# Patient Record
Sex: Male | Born: 1960 | Race: White | Hispanic: No | Marital: Single | Smoking: Never smoker
Health system: Southern US, Community
[De-identification: ages and names within clinical notes are randomized; demographics above are authoritative.]

## PROBLEM LIST (undated history)

## (undated) DIAGNOSIS — E785 Hyperlipidemia, unspecified: Secondary | ICD-10-CM

## (undated) DIAGNOSIS — C20 Malignant neoplasm of rectum: Secondary | ICD-10-CM

## (undated) DIAGNOSIS — E119 Type 2 diabetes mellitus without complications: Secondary | ICD-10-CM

## (undated) DIAGNOSIS — G4733 Obstructive sleep apnea (adult) (pediatric): Secondary | ICD-10-CM

## (undated) DIAGNOSIS — T7840XA Allergy, unspecified, initial encounter: Secondary | ICD-10-CM

## (undated) DIAGNOSIS — Z87442 Personal history of urinary calculi: Secondary | ICD-10-CM

## (undated) DIAGNOSIS — C801 Malignant (primary) neoplasm, unspecified: Secondary | ICD-10-CM

## (undated) DIAGNOSIS — I10 Essential (primary) hypertension: Secondary | ICD-10-CM

## (undated) HISTORY — DX: Malignant (primary) neoplasm, unspecified: C80.1

## (undated) HISTORY — DX: Allergy, unspecified, initial encounter: T78.40XA

## (undated) HISTORY — DX: Obstructive sleep apnea (adult) (pediatric): G47.33

## (undated) HISTORY — DX: Type 2 diabetes mellitus without complications: E11.9

## (undated) HISTORY — DX: Hyperlipidemia, unspecified: E78.5

## (undated) HISTORY — PX: APPENDECTOMY: SHX54

## (undated) HISTORY — DX: Essential (primary) hypertension: I10

## (undated) HISTORY — DX: Malignant neoplasm of rectum: C20

## (undated) HISTORY — PX: HERNIA REPAIR: SHX51

---

## 2011-10-21 NOTE — Progress Notes (Addendum)
 [phone] (home) [phone] (work)  Subjective:  Adam Gregory is a 51 y.o. yo male here for  Chief Complaint  Patient presents with  . Worrisome Lesion    Scalp  .    Problem 1 Problem 2 Problem 3  Location/Area involved? Scalp    How Long? 4 1/2 weeks    Symptoms (itch/pain?) sore    Bleeding? no    Change size/shape/color? Increase in size over the weeks    Treatment Tried? no    Factors better/worse? worse      Please see the medications, PMH, allergies, FH, and SH as reviewed in the sections above.  Objective:  WDWN male in NAD; A & O x 3 with normal affect  1.0 cm crusted keratotic papule      Exam Lexicon - AK = red, scaly papule ; SK = stuck on waxy plaque ; C = cherry red papule; NEVI = pigmented macule/papule ; BCC = pearly papule/plaque/nodule ; SCC= keratotic indurated papule/nodule; Ecz = scaling erythematous patches/plaques; MC= flesh colored umbilicated papule; VV = rough, warty papule/plaque; NERD = no evidence of recurrent disease at site of skin cancer removal ; DF= firm, brown papule with positive retraction sign; ISK = stuck on waxy papule with erythema and/or crusting; EIC = cystic nodule/papule with punctum   Skin exam performed on following areas:  Scalp - declined fbse Exam within normal limits unless noted above.   Assessment and Plan:  1. Neoplasm of uncertain behavior of skin  CUTANEOUS PATH REQ-HITCH/SANG   Rule out Squamous cell carcinoma Site 1: left parietal scalp   Biopsy:   After explaining the nature of the procedure, risks (including, but not limited to bleeding, infection, scarring, recurrence, numbness), benefits, and alternatives, the patient signed the written informed consent.  The site was confirmed and marked, the patient was identified and time out was taken.  The skin was cleansed with alcohol.  Local anesthesia was achieved with 1% lidocaine with epinephrine buffered with sodium bicarbonate.  A portion of the lesion  was removed using a dermablade and the specimen was submitted for pathology.  Hemostasis was achieved with 50% aluminum chloride, and a dressing of petrolatum and a bandaid was applied.    *All new prescription medications, changes in current prescription dosages and sample medications were discussed with the patient, including patient education, medication name, use, dosage, potential side effects, complications, and special instructions. *Sun avoidance and sun protection discussed *Patient to call for appointment for any new, nonhealing, or changing lesion

## 2014-09-12 ENCOUNTER — Ambulatory Visit (INDEPENDENT_AMBULATORY_CARE_PROVIDER_SITE_OTHER): Payer: 59 | Admitting: Family Medicine

## 2014-09-12 VITALS — BP 130/82 | HR 75 | Temp 97.6°F | Resp 18 | Ht 74.0 in | Wt >= 6400 oz

## 2014-09-12 DIAGNOSIS — R059 Cough, unspecified: Secondary | ICD-10-CM

## 2014-09-12 DIAGNOSIS — R05 Cough: Secondary | ICD-10-CM | POA: Diagnosis not present

## 2014-09-12 DIAGNOSIS — J0101 Acute recurrent maxillary sinusitis: Secondary | ICD-10-CM

## 2014-09-12 MED ORDER — BENZONATATE 200 MG PO CAPS: 200.0000 mg | ORAL_CAPSULE | Freq: Three times a day (TID) | ORAL | Status: DC | PRN

## 2014-09-12 MED ORDER — LEVOFLOXACIN 500 MG PO TABS: 500.0000 mg | ORAL_TABLET | Freq: Every day | ORAL | Status: DC

## 2014-09-12 NOTE — Patient Instructions (Signed)

## 2014-09-12 NOTE — Progress Notes (Signed)
Patient ID: Adam Gregory, male   DOB: [birth year], 54 y.o.   MRN: [id number]  This chart was scribed for Robyn Haber, MD by Ladene Artist, ED Scribe. The patient was seen in room 10. Patient's care was started at 11:40 AM.  Patient ID: Adam Gregory MRN: [id number], DOB: 1960/10/21, 54 y.o. Date of Encounter: 09/12/2014, 11:40 AM  Primary Physician: No PCP Per Patient  Chief Complaint  Patient presents with  . Sinusitis    x6 days   . Nasal Congestion    green mucous   . Sore Throat   HPI: 54 y.o. year old male with history below presents with persistent sore throat for the past 6 days. with history below presents with persistent sore throat for the past 6 days. Pt describes sore throat as a burning sensation that is exacerbated with swallowing. He states that sore throat is so severe that it keeps him up at night. He reports associated nasal congestion with green mucous, cough, throbbing HA for the past few days. Pt has been treating with Zyrtec D and gargling warm salt water without relief. He reports h/o sleep apnea; he uses a cpap which has improved sinusitis.   Pt works as a Engineer, technical sales.  Past Medical History  Diagnosis Date  . Allergy   . Cancer     Home Meds: Prior to Admission medications   Medication Sig Start Date End Date Taking? Authorizing Provider  cetirizine (ZYRTEC) 10 MG tablet Take 10 mg by mouth daily.   Yes Historical Provider, MD    Allergies:  Allergies  Allergen Reactions  . Codeine   . Ivp Dye [Iodinated Diagnostic Agents]   . Shellfish Allergy   . Vicodin [Hydrocodone-Acetaminophen]     History   Social History  . Marital Status: Single    Spouse Name: N/A  . Number of Children: N/A  . Years of Education: N/A   Occupational History  . Not on file.   Social History Main Topics  . Smoking status: Never Smoker   . Smokeless tobacco: Not on file  . Alcohol Use: 0.6 oz/week    1 Standard drinks or equivalent per week  . Drug Use: No  . Sexual Activity: Not on file   Other Topics Concern   . Not on file   Social History Narrative  . No narrative on file    Review of Systems: Constitutional: negative for chills, fever, night sweats, weight changes, or fatigue  HEENT: negative for vision changes, hearing loss, rhinorrhea, epistaxis, or sinus pressure, + sore throat,  + congestion  Cardiovascular: negative for chest pain or palpitations Respiratory: negative for hemoptysis, wheezing, shortness of breath, + cough Abdominal: negative for abdominal pain, nausea, vomiting, diarrhea, or constipation Dermatological: negative for rash Neurologic: negative for dizziness, or syncope, + headache All other systems reviewed and are otherwise negative with the exception to those above and in the HPI.  Physical Exam: Blood pressure 130/82, pulse 75, temperature 97.6 F (36.4 C), temperature source Oral, resp. rate 18, height 6\' 2"  (1.88 m), weight 440 lb (199.583 kg), SpO2 96 %., Body mass index is 56.47 kg/(m^2). General: Well developed, well nourished, in no acute distress. Head: Normocephalic, atraumatic, eyes without discharge, sclera non-icteric. Mucosal purulent discharge. Bilateral auditory canals clear, TM's are without perforation, pearly grey and translucent with reflective cone of light bilaterally. Oral cavity moist, posterior pharynx with erythema. No posterior pharynx exudate, peritonsillar abscess, or post nasal drip.  Neck: Supple. No thyromegaly. Full ROM. No lymphadenopathy. Lungs: Few expiratory wheezes. No rales, or rhonchi. Breathing is unlabored.  Heart: RRR with S1 S2. No murmurs, rubs, or gallops appreciated. Abdomen: Soft, non-tender, non-distended with normoactive bowel sounds. No hepatomegaly. No rebound/guarding. No obvious abdominal masses. Msk:  Strength and tone normal for 54. Extremities/Skin: Warm and dry. No clubbing or cyanosis. No edema. No rashes or suspicious lesions. Neuro: Alert and oriented X 3. Moves all extremities spontaneously. Gait is normal.  CNII-XII grossly in tact. Psych:  Responds to questions appropriately with a normal affect.    ASSESSMENT AND PLAN:  54 y.o. year old male with  1. Recurrent maxillary sinusitis, unspecified chronicity   2. Cough    This chart was scribed in my presence and reviewed by me personally.    ICD-9-CM ICD-10-CM   1. Recurrent maxillary sinusitis, unspecified chronicity 461.0 J01.01 levofloxacin (LEVAQUIN) 500 MG tablet  2. Cough 786.2 R05 benzonatate (TESSALON) 200 MG capsule     Signed, Robyn Haber, MD   Signed, Robyn Haber, MD 09/12/2014 11:40 AM

## 2018-12-14 NOTE — H&P (Signed)
 [facility]  History and Physical   Assessment  Colorectal cancer screening.  Plan  Screening colonoscopy.  History  Adam Gregory is a 58 y.o. male who presents for colonoscopy. History of Present Illness   Past Medical History:  Diagnosis Date  . Diabetes mellitus (*)   . OSA on CPAP    Past Surgical History:  Procedure Laterality Date  . Appendectomy    . Inguinal hernia repair Bilateral    2 on R, 1 on L    Allergies  Allergen Reactions  . Shellfish-Derived Products Anaphylaxis  . Vicodin [Hydrocodone-Acetaminophen ] Itching  . Codeine Nausea And Vomiting  . Iodinated Diagnostic Agents Hives and Other    Sends into cardiac arrest   Prior to Admission medications   Medication Sig Start Date End Date Taking? Authorizing Provider  BAYER CONTOUR NEXT TEST test strip TEST 3 TIMES A DAY 05/05/17  Yes Lynwood JAYSON Mirza, MD  Blood Glucose Calibration LIQD 1 each by In Vitro route as needed. 03/10/15  Yes Jessica M Rebollar, PA-C  Blood Glucose Monitoring Suppl (BAYER CONTOUR NEXT MONITOR) W/DEVICE KIT Inject 1 each into the skin 3 (three) times a day. To check blood sugar 06/08/15  Yes Lynwood JAYSON Mirza, MD  cetirizine (ZYRTEC) 10 mg tablet Take 10 mg by mouth daily.    Yes Historical Provider, MD  KRILL OIL PO Take by mouth.   Yes Historical Provider, MD  metFORMIN (GLUCOPHAGE) 500 MG tablet Take one tablet (500 mg dose) by mouth 2 (two) times daily with meals. 09/28/18  Yes Lynwood JAYSON Mirza, MD  [facility] 50 MCG/0.5ML injection Inject 0.5 mLs into the muscle once as needed. And repeat once after 2-6 months 03/20/18 03/20/19  Lynwood JAYSON Mirza, MD   Social History   Socioeconomic History  . Marital status: Single    Spouse name: Not on file  . Number of children: 0  . Years of education: Not on file  . Highest education level: Not on file  Occupational History  . Not on file  Social Needs  . Financial resource strain: Not on file  . Food insecurity     Worry: Not on file    Inability: Not on file  . Transportation needs    Medical: Not on file    Non-medical: Not on file  Tobacco Use  . Smoking status: Former Smoker    Packs/day: 0.00    Last attempt to quit: 03/07/2009    Years since quitting: 9.7  . Smokeless tobacco: Never Used  Substance and Sexual Activity  . Alcohol use: No    Comment: rarely  . Drug use: No  . Sexual activity: Not on file  Lifestyle  . Physical activity    Days per week: Not on file    Minutes per session: Not on file  . Stress: Not on file  Relationships  . Social Musician on phone: Not on file    Gets together: Not on file    Attends religious service: Not on file    Active member of club or organization: Not on file    Attends meetings of clubs or organizations: Not on file    Relationship status: Not on file  . Intimate partner violence    Fear of current or ex partner: Not on file    Emotionally abused: Not on file    Physically abused: Not on file    Forced sexual activity: Not on file  Other Topics Concern  . Not on file  Social History Narrative  . Not on file   Family History  Problem Relation Age of Onset  . Hypertension Mother   . Diabetes Father   . Lymphoma Sister        NHL  . Stroke Brother        1 in each optic nerve  . Cancer Neg Hx   . Heart attack Neg Hx    Review of Systems  All other systems reviewed and are negative.        Physical Examination           Physical Exam Cardiovascular:     Rate and Rhythm: Normal rate and regular rhythm.  Pulmonary:     Effort: Pulmonary effort is normal.     Breath sounds: Normal breath sounds.  Abdominal:     General: Abdomen is flat. Bowel sounds are normal.     Palpations: Abdomen is soft.  Neurological:     Mental Status: He is alert and oriented to person, place, and time.    Results  Labs:  No results found for this or any previous visit (from the past 24 hour(s)). Imaging: No results  found.  Coding  Electronically signed: Feliciano MARLA Hamilton, MD 12/14/2018 / 12:19 PM

## 2019-01-01 NOTE — Progress Notes (Signed)
 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 Darral Medical Oncologist: Dr. Clotilda Radiation Oncologist: Dr. Mollie Coup cancer [facility])  12/2018 Initial Diagnosis   Anal cancer [facility]) - presented with anal mass and rectal bleeding   12/14/2018 Interventional Procedure   Colonoscopy with biopsy: Large ulcerated anorectal mass abutting the dentate line. 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 male with T2DM, OSA, HLD, morbid obesity, and new diagnosis of anal cancer. He initially presented with a new palpable anal mass and rectal bleeding. He had eaten some celery and had increased bleeding with BMs after that. Since then he has had some spotting. He told his PCP who referred him to GI for a colonoscopy. The colonoscopy on 12/14/18 showed a large ulcerated anorectal mass abutting the dentate line with biopsy revealing invasive poorly differentiated squamous cell carcinoma.    Currently feeling ok. Denied any new masses/swelling in the groin or neck. No history of STDs or HIV. Quit smoking many years ago. BMs for the most part are normal. With some mucous discharge at times when he has tenesmus sensation. Has lost 26 lbs over the last few months. Working full time as a Psychologist, prison and probation services.  Prior RT: No IED: No Scleroderma, SLE, MCTD or RA: No  ROS: 14 point review of systems was covered and is negative except as mentioned above in HPI.   Meds Ordered in [place]  Medication Sig Dispense Refill  . cetirizine (ZYRTEC) 10 MG tablet Take 10 mg by mouth.    . metFORMIN (GLUCOPHAGE) 500 MG tablet 500 mg 2 times daily with meals.       . predniSONE  (DELTASONE ) 50 MG tablet One tablet 13 hours, one tablet 7 hours and one tablet an hour before MRI. Also take Bendaryl 50 mg OTC once an hour before MRI. 3 tablet 0    Current Facility-Administered Medications Ordered in Epic  Medication Dose Route Frequency Provider Last Rate Last Dose  . [START ON 01/07/2019] glucagon (human recombinant) injection 1 mg  1 mg Intramuscular Once Syed Ali Margarito Pringle, MD        No past medical history on file.  No past surgical history on file.  No family history on file.  Allergies  Allergen Reactions  . Hydrocodone-Acetaminophen  Itching / Pruritis (ALLERGY/intolerance)  . Iodine And Iodide Containing Products Urticaria / Hives (ALLERGY) and Itching / Pruritis (ALLERGY/intolerance)    Sends into cardiac arrest Anaphylaxis   . Codeine Nausea & Vomiting (ALLERGY/intolerance)  . Shellfish Containing Products Urticaria / Hives (ALLERGY)    Social History   Socioeconomic History  . Marital status: Single    Spouse name: Not on file  . Number of children: Not on file  . Years of education: Not on file  . Highest education level: Not on file  Occupational History  . Not on file  Social Needs  . Financial resource strain: Not on file  . Food insecurity    Worry: Not on file    Inability: Not on file  . Transportation needs    Medical: Not on file    Non-medical: Not on file  Tobacco Use  . Smoking status: Former Games developer  . Smokeless tobacco: Never Used  Substance and Sexual Activity  . Alcohol use: Not on file  . Drug use: Not on file  . Sexual activity: Not on  file  Lifestyle  . Physical activity    Days per week: Not on file    Minutes per session: Not on file  . Stress: Not on file  Relationships  . Social Musician on phone: Not on file    Gets together: Not on file    Attends religious service: Not on file    Active member of club or organization: Not on file    Attends meetings of clubs or organizations: Not on file    Relationship status: Not on file  Other Topics Concern  . Not on file  Social History Narrative  . Not on file    ADDRESS: [address] [place] [facility]  [id number]   Physical Exam: Vital Signs:  Vitals:   01/04/19 0848  BP: 170/71  Pulse: 61  Resp: 16  SpO2: 100%  PainSc: 0-Zero   Wt Readings from Last 3 Encounters:  01/04/19 (!) 178.6 kg (393 lb 12.8 oz)  12/29/18 (!) 182.8 kg (403 lb)   Pain score: 0-Zero   GENERAL: ECOG: 0 - Fully active; no performance restrictions KPS (100-90%). Well developed, well nourished, in no acute distress. Alert & oriented, converses well.  EYES: Pupils equal, round and reactive to light. Extraocular movements intact. EARS/NOSE/MOUTH/THROAT: Nose normal. Oral mucosa moist, without lesions. CARDIOVASCULAR: Regular rate and rhythm. Normal heart sounds. RESPIRATORY: Normal respiratory effort. Lungs clear to auscultation bilaterally. ABDOMEN: Normal bowel sounds. Soft, non-tender, non-distended. EXTREMITIES: Well-perfused. NEUROLOGIC EXAM: CN II-XII grossly intact. No focal neurologic deficits appreciated. Strength 5/5 in the bilateral upper and lower extremity muscle groups. Sensation intact to light touch throughout.  SKIN: Warm, dry, intact. No rashes noted.  LYMPH: No cervical, supraclavicular, infraclavicular or axillary adenopathy  Rectal exam: no evidence of inflamed external hemorrhoids.  Normal sphincter tone. Appreciated a 2-3 cm mass at 6 oclock with patient in left lateral decubitus position (patient's left). No blood grossly on the exam glove.    Labs: Results for orders placed or performed in visit on 12/29/18 (from the past 336 hour(s))  Comprehensive Metabolic Panel   Collection Time: 12/29/18 10:42 AM  Result Value Ref Range   Sodium 140 135 - 146 MMOL/L   Potassium 4.2 3.5 - 5.3 MMOL/L   Chloride 107 98 - 110 MMOL/L   CO2 27 23 - 30 MMOL/L   BUN 13 8 - 24 MG/DL   Glucose 878 (H) 70 - 99 MG/DL   Creatinine 9.09 9.49 - 1.50 MG/DL   Calcium 9.1 8.5 - 89.4 MG/DL   Total Protein 7.5 6.0 - 8.3 G/DL   Albumin  4.0 3.5 - 5.0 G/DL   Total Bilirubin 0.5 0.1 - 1.2 MG/DL   Alkaline  Phosphatase 61 25 - 125 IU/L or U/L   AST (SGOT) 17 5 - 40 IU/L or U/L   ALT (SGPT) 22 5 - 50 IU/L or U/L   Anion Gap 6 MMOL/L   Est. GFR Non-African American >=90 >=60 ML/MIN/1.73 M*2   HIV Antigen Antibody Combo   Collection Time: 12/29/18 10:42 AM  Result Value Ref Range   HIV Antigen Antibody Combo Non-Reactive Non-Reactive  Hepatitis Panel   Collection Time: 12/29/18 10:42 AM  Result Value Ref Range   Hepatitis B Surface Antibody, QL REACTIVE (EQUIVALENT TO >=10 mIU/mL)    HEP-B Core AB Non-Reactive Non-Reactive   Hepatitis C Antibody Non-Reactive Non-Reactive   Hepatitis A (IGG and IGM) Antibody Reactive (A) Non-Reactive   Hepatitis B Surface Antigen Non-Reactive Non-Reactive  Hepatitis  A Ab (IgM)   Collection Time: 12/29/18 10:42 AM  Result Value Ref Range   Hepatitis A IGM Antibody Non-Reactive Non-Reactive  CBC and Differential   Collection Time: 12/29/18 10:42 AM  Result Value Ref Range   WBC 6.9 4.8 - 10.8 x 10*3/uL   RBC 4.47 (L) 4.70 - 6.10 x 10*6/uL   Hemoglobin 13.5 (L) 14.0 - 18.0 G/DL   Hematocrit 59.9 (L) 57.9 - 52.0 %   MCV 89.4 80.0 - 94.0 FL   MCH 30.2 27.0 - 31.0 PG   MCHC 33.7 33.0 - 37.0 G/DL   RDW 86.5 88.4 - 85.4 %   Platelets 214 160 - 360 X 10*3/uL   MPV 9.7 6.8 - 10.2 FL   Neutrophil % 69 %   Lymphocyte % 20 %   Monocyte % 10 %   Eosinophil % 1 %   Basophil % 1 %   Neutrophil Absolute 4.7 1.6 - 7.3 x 10*3/uL   Lymphocyte Absolute 1.4 1.0 - 5.1 x 10*3/uL   Monocyte Absolute 0.7 0.2 - 0.9 x 10*3/uL   Eosinophil Absolute 0.1 0.0 - 0.5 x 10*3/uL   Basophil Absolute 0.0 0.0 - 0.2 x 10*3/uL   NRBC Manual 0 <=0 / 100 WBC   nRBC      Colonoscopy (12/14/18) Postprocedure Impression:  Anorectal mass lesion which is ulcerated and consistent with malignancy,  biopsied. Hepatic flexure colon polyp, removed. Extensive left-sided diverticulosis.  Findings: Large palpable lesion on rectal exam. There is a large mass which is ulcerated abutting the  dentate line.  It is  hard to determine whether this originates from the anus versus the rectum.   Biopsies were obtained.  This is consistent with a malignancy. 4 mm sessile colon polyp at the hepatic flexure removed with a cold snare. Extensive left-sided diverticulosis.   Radiology:  No new pertinent radiography available for review.   Pathology:  12/14/18 1.  Colon, hepatic flexure, polyp, polypectomy: -Colonic mucosa with benign submucosal leiomyoma; multiple levels examined  2.  Colon, rectal anal mass, biopsy: -Detached fragments of invasive poorly differentiated squamous cell carcinoma, see comment.  COMMENT: Immunohistochemical stains are positive for p40 and p16.   Assessment and Plan: Moe Graca is a 58 y.o. male with a new diagnosis of anal cancer which is incompletely staged at this time. He has MRI and PET imaging scheduled tomorrow. He has met with Dr. Clotilda with medical oncology already and has an appointment scheduled with colorectal surgery with Dr. Darral. He presents today for evaluation of definitive radiotherapy.  We discussed the general treatment paradigms for anal squamous cell carcinoma including chemoradiotherapy, RT alone or local excision. We discussed the logistics of therapy as well as the potential toxicities of treatment including but not limited to anal pain/irritation, pain or bleeding with defecation, diarrhea, nausea/vomiting, fatigue, skin irritation, weight loss, damage to normal structures like bowel (including bowel obstruction) or bladder. Ozell Sherwood Minerva expressed understanding and agreement with the plan. He has signed consent for treatment today and we will proceed with treatment planning in the near future. He has seen medical oncology for evaluation of concurrent chemotherapy.      Electronically signed by: Rosalva Lamar Sandy, MD Resident 01/04/19 [phone]

## 2019-02-15 ENCOUNTER — Other Ambulatory Visit: Payer: Self-pay | Admitting: Internal Medicine

## 2019-02-15 ENCOUNTER — Other Ambulatory Visit: Payer: Self-pay | Admitting: Neurosurgery

## 2019-02-15 DIAGNOSIS — M5412 Radiculopathy, cervical region: Secondary | ICD-10-CM

## 2019-02-15 DIAGNOSIS — M5416 Radiculopathy, lumbar region: Secondary | ICD-10-CM

## 2019-02-15 DIAGNOSIS — C210 Malignant neoplasm of anus, unspecified: Secondary | ICD-10-CM

## 2019-02-22 ENCOUNTER — Other Ambulatory Visit: Payer: Self-pay | Admitting: Internal Medicine

## 2019-03-13 ENCOUNTER — Other Ambulatory Visit: Payer: Self-pay

## 2019-03-22 ENCOUNTER — Ambulatory Visit
Admission: RE | Admit: 2019-03-22 | Discharge: 2019-03-22 | Disposition: A | Discharge status: Home / Self Care | Payer: BC Managed Care – PPO | Source: Ambulatory Visit | Attending: Internal Medicine | Admitting: Internal Medicine

## 2019-03-22 DIAGNOSIS — C210 Malignant neoplasm of anus, unspecified: Secondary | ICD-10-CM

## 2019-08-22 ENCOUNTER — Emergency Department
Admission: EM | Admit: 2019-08-22 | Discharge: 2019-08-22 | Disposition: A | Discharge status: Home / Self Care | Payer: BC Managed Care – PPO | Attending: Emergency Medicine | Admitting: Emergency Medicine

## 2019-08-22 ENCOUNTER — Encounter: Payer: Self-pay | Admitting: Emergency Medicine

## 2019-08-22 ENCOUNTER — Other Ambulatory Visit: Payer: Self-pay

## 2019-08-22 ENCOUNTER — Emergency Department: Payer: BC Managed Care – PPO

## 2019-08-22 DIAGNOSIS — R1031 Right lower quadrant pain: Secondary | ICD-10-CM | POA: Diagnosis present

## 2019-08-22 DIAGNOSIS — Z79899 Other long term (current) drug therapy: Secondary | ICD-10-CM | POA: Diagnosis not present

## 2019-08-22 DIAGNOSIS — N200 Calculus of kidney: Secondary | ICD-10-CM | POA: Insufficient documentation

## 2019-08-22 LAB — URINALYSIS, COMPLETE (UACMP) WITH MICROSCOPIC
Bacteria, UA: NONE SEEN
Bilirubin Urine: NEGATIVE
Glucose, UA: 150 mg/dL — AB
Hgb urine dipstick: NEGATIVE
Ketones, ur: 5 mg/dL — AB
Leukocytes,Ua: NEGATIVE
Nitrite: NEGATIVE
Protein, ur: NEGATIVE mg/dL
Specific Gravity, Urine: 1.021 (ref 1.005–1.030)
Squamous Epithelial / HPF: NONE SEEN (ref 0–5)
pH: 7 (ref 5.0–8.0)

## 2019-08-22 LAB — COMPREHENSIVE METABOLIC PANEL
ALT: 34 U/L (ref 0–44)
AST: 26 U/L (ref 15–41)
Albumin: 4 g/dL (ref 3.5–5.0)
Alkaline Phosphatase: 66 U/L (ref 38–126)
Anion gap: 11 (ref 5–15)
BUN: 17 mg/dL (ref 6–20)
CO2: 23 mmol/L (ref 22–32)
Calcium: 9.2 mg/dL (ref 8.9–10.3)
Chloride: 105 mmol/L (ref 98–111)
Creatinine, Ser: 1.14 mg/dL (ref 0.61–1.24)
GFR calc Af Amer: >60 mL/min (ref >60)
GFR calc non Af Amer: >60 mL/min (ref >60)
Glucose, Bld: 211 mg/dL — ABNORMAL HIGH (ref 70–99)
Potassium: 4.1 mmol/L (ref 3.5–5.1)
Sodium: 139 mmol/L (ref 135–145)
Total Bilirubin: 0.6 mg/dL (ref 0.3–1.2)
Total Protein: 7.5 g/dL (ref 6.5–8.1)

## 2019-08-22 LAB — CBC
HCT: 38.4 % — ABNORMAL LOW (ref 39.0–52.0)
Hemoglobin: 12.8 g/dL — ABNORMAL LOW (ref 13.0–17.0)
MCH: 31.3 pg (ref 26.0–34.0)
MCHC: 33.3 g/dL (ref 30.0–36.0)
MCV: 93.9 fL (ref 80.0–100.0)
Platelets: 207 10*3/uL (ref 150–400)
RBC: 4.09 MIL/uL — ABNORMAL LOW (ref 4.22–5.81)
RDW: 13.2 % (ref 11.5–15.5)
WBC: 9.8 10*3/uL (ref 4.0–10.5)
nRBC: 0 % (ref 0.0–0.2)

## 2019-08-22 LAB — LIPASE, BLOOD: Lipase: 37 U/L (ref 11–51)

## 2019-08-22 MED ORDER — SODIUM CHLORIDE 0.9 % IV BOLUS
500.0000 mL | Freq: Once | INTRAVENOUS | Status: AC
  Administered 2019-08-22: 500 mL via INTRAVENOUS

## 2019-08-22 MED ORDER — FENTANYL CITRATE (PF) 100 MCG/2ML IJ SOLN
50.0000 ug | INTRAMUSCULAR | Status: DC | PRN
  Administered 2019-08-22: 50 ug via INTRAVENOUS
  Filled 2019-08-22: qty 2

## 2019-08-22 MED ORDER — TAMSULOSIN HCL 0.4 MG PO CAPS: 0.4000 mg | ORAL_CAPSULE | Freq: Every day | ORAL | 0 refills | Status: DC

## 2019-08-22 MED ORDER — KETOROLAC TROMETHAMINE 30 MG/ML IJ SOLN
30.0000 mg | Freq: Once | INTRAMUSCULAR | Status: AC
  Administered 2019-08-22: 30 mg via INTRAVENOUS
  Filled 2019-08-22: qty 1

## 2019-08-22 MED ORDER — SODIUM CHLORIDE 0.9% FLUSH: 3.0000 mL | Freq: Once | INTRAVENOUS | Status: DC

## 2019-08-22 MED ORDER — ONDANSETRON 4 MG PO TBDP: 4.0000 mg | ORAL_TABLET | Freq: Four times a day (QID) | ORAL | 0 refills | Status: DC | PRN

## 2019-08-22 MED ORDER — OXYCODONE-ACETAMINOPHEN 5-325 MG PO TABS: 1.0000 | ORAL_TABLET | Freq: Four times a day (QID) | ORAL | 0 refills | Status: DC | PRN

## 2019-08-22 MED ORDER — ONDANSETRON HCL 4 MG/2ML IJ SOLN
4.0000 mg | Freq: Once | INTRAMUSCULAR | Status: AC | PRN
  Administered 2019-08-22: 4 mg via INTRAVENOUS
  Filled 2019-08-22: qty 2

## 2019-08-22 NOTE — ED Provider Notes (Signed)
[facility] Emergency Department Provider Note ____________________________________________   First MD Initiated Contact with Patient 08/22/19 1213     (approximate)  I have reviewed the triage vital signs and the nursing notes.   HISTORY  Chief Complaint Abdominal Pain and Nausea  HPI Adam Gregory is a 59 y.o. male here for evaluation of right lower abdominal pain  Patient reports about 3 AM he awoke to pain in his right lower abdomen.  This associated significant pain causing him to vomit a couple times.  He felt sort of like he had a bowel movement need a bowel movement but did not need to.  He is not any diarrhea.  Reports the pain started to ease up now.  Is located in his right lower abdomen, but not as severe.  He feels better.  Has a history of kidney stones on the left that feels somewhat similar though this 1 did not feel so much in his back  No recent illness.  No chest pain or shortness of breath.  No fevers or chills.  No plain pain burning or blood in his urine   Past Medical History:  Diagnosis Date  . Allergy   . Cancer ([place])     There are no problems to display for this patient.   Past Surgical History:  Procedure Laterality Date  . APPENDECTOMY    . HERNIA REPAIR      Prior to Admission medications   Medication Sig Start Date End Date Taking? Authorizing Provider  atorvastatin (LIPITOR) 20 MG tablet Take 20 mg by mouth at bedtime.  08/07/19  Yes [provider]  cetirizine (ZYRTEC) 10 MG tablet Take 10 mg by mouth at bedtime.    Yes [provider]  losartan (COZAAR) 25 MG tablet Take 25 mg by mouth daily. 06/25/19  Yes [provider]  metFORMIN (GLUCOPHAGE) 500 MG tablet Take 500 mg by mouth 2 (two) times daily. 08/04/19  Yes [provider]  benzonatate (TESSALON) 200 MG capsule Take 1 capsule (200 mg total) by mouth 3 (three) times daily as needed for cough. Patient not taking: Reported on  08/22/2019 09/12/14   Robyn Haber, MD  levofloxacin (LEVAQUIN) 500 MG tablet Take 1 tablet (500 mg total) by mouth daily. Patient not taking: Reported on 08/22/2019 09/12/14   Robyn Haber, MD    Allergies Codeine, Ivp dye [iodinated diagnostic agents], Shellfish allergy, and Vicodin [hydrocodone-acetaminophen]  Discussed with the patient, reports that he can take Percocet tablets without allergic reaction just cannot have Vicodin.  No family history on file.  Social History Social History   Tobacco Use  . Smoking status: Never Smoker  Substance Use Topics  . Alcohol use: Yes    Alcohol/week: 1.0 standard drinks    Types: 1 Standard drinks or equivalent per week  . Drug use: No    Review of Systems Constitutional: No fever/chills Eyes: No visual changes. ENT: No sore throat. Cardiovascular: Denies chest pain. Respiratory: Denies shortness of breath. Gastrointestinal: See HPI Genitourinary: Negative for dysuria. Musculoskeletal: Negative for back pain. Skin: Negative for rash. Neurological: Negative for headaches, areas of focal weakness or numbness.  Patient reports a known history of inguinal hernias  ____________________________________________   PHYSICAL EXAM:  VITAL SIGNS: ED Triage Vitals  Enc Vitals Group     BP 08/22/19 0850 (!) 196/87     Pulse Rate 08/22/19 0848 66     Resp 08/22/19 1215 (!) 23     Temp 08/22/19 0848  97.7 F (36.5 C)     Temp Source 08/22/19 0848 Oral     SpO2 08/22/19 0848 97 %     Weight 08/22/19 0845 (!) 411 lb (186.4 kg)     Height 08/22/19 0845 6\' 3"  (1.905 m)     Head Circumference --      Peak Flow --      Pain Score 08/22/19 0844 7     Pain Loc --      Pain Edu? --      Excl. in [place]? --     Constitutional: Alert and oriented. Well appearing and in no acute distress. Eyes: Conjunctivae are normal. Head: Atraumatic. Nose: No congestion/rhinnorhea. Mouth/Throat: Mucous membranes are moist. Neck: No stridor.    Cardiovascular: Normal rate, regular rhythm. Grossly normal heart sounds.  Good peripheral circulation. Respiratory: Normal respiratory effort.  No retractions. Lungs CTAB. Gastrointestinal: Soft and nontender. No distention.  Obese. Musculoskeletal: No lower extremity tenderness nor edema. Neurologic:  Normal speech and language. No gross focal neurologic deficits are appreciated.  Skin:  Skin is warm, dry and intact. No rash noted. Psychiatric: Mood and affect are normal. Speech and behavior are normal.  ____________________________________________   LABS (all labs ordered are listed, but only abnormal results are displayed)  Labs Reviewed  COMPREHENSIVE METABOLIC PANEL - Abnormal; Notable for the following components:      Result Value   Glucose, Bld 211 (*)    All other components within normal limits  CBC - Abnormal; Notable for the following components:   RBC 4.09 (*)    Hemoglobin 12.8 (*)    HCT 38.4 (*)    All other components within normal limits  URINALYSIS, COMPLETE (UACMP) WITH MICROSCOPIC - Abnormal; Notable for the following components:   Color, Urine YELLOW (*)    APPearance CLEAR (*)    Glucose, UA 150 (*)    Ketones, ur 5 (*)    All other components within normal limits  LIPASE, BLOOD   ____________________________________________  EKG   ____________________________________________  RADIOLOGY  CT Renal Stone Study  Result Date: 08/22/2019 CLINICAL DATA:  Flank pain, kidney stone suspected EXAM: CT ABDOMEN AND PELVIS WITHOUT CONTRAST TECHNIQUE: Multidetector CT imaging of the abdomen and pelvis was performed following the standard protocol without IV contrast. COMPARISON:  CT abdomen pelvis 06/01/2019 FINDINGS: Lower chest: Multiple small nodules in the bilateral lung bases, similar in appearance to the prior CT. Evaluation of the abdominal viscera is somewhat limited by the lack of IV contrast. Hepatobiliary: No focal liver abnormality is seen. No  gallstones, gallbladder wall thickening, or biliary dilatation. Pancreas: Unremarkable. Spleen: Normal in size without focal abnormality. Adrenals/Urinary Tract: Adrenal glands are unremarkable. The right kidney is enlarged with perinephric fat stranding. There is mild fullness of the right renal collecting system. There is a 3 mm calculus in the distal right ureter just above the UVJ (series 5, image 124). The left kidney is unremarkable. The urinary bladder is normal in appearance. Stomach/Bowel: Stomach is within normal limits. Appendix is surgically absent. No evidence of bowel wall thickening, distention, or inflammatory changes. Multiple colonic diverticula without evidence of diverticulitis. Vascular/Lymphatic: Minimal aortic atherosclerosis. Vascular patency cannot be assessed in the absence of IV contrast. No enlarged abdominal or pelvic lymph nodes. Reproductive: Prostate is unremarkable. Other: Bilateral right greater than left fat containing inguinal hernias. Small fat containing umbilical hernia. No abdominopelvic ascites. Musculoskeletal: Degenerative disc disease at L5-S1 and at the bilateral sacroiliac joints. No acute osseous finding. IMPRESSION: 1.  The right kidney is enlarged with perinephric fat stranding. There is a 3 mm calculus in the distal right ureter just above the UVJ. There is very mild associated right hydronephrosis and ureterectasis. 2. Colonic diverticulosis without evidence of diverticulitis. 3. Bilateral fat containing inguinal hernias. Small fat containing umbilical hernia. 4. Multiple small pulmonary nodules in the visualized lung bases, similar in appearance to the prior CT. Electronically Signed   By: Audie Pinto M.D.   On: 08/22/2019 11:01     Imaging studies reviewed, most notable for 3 mm calculus distal right ureter.  Mild right hydronephrosis ____________________________________________   PROCEDURES  Procedure(s) performed: None  Procedures  Critical  Care performed: No  ____________________________________________   INITIAL IMPRESSION / ASSESSMENT AND PLAN / ED COURSE  Pertinent labs & imaging results that were available during my care of the patient were reviewed by me and considered in my medical decision making (see chart for details).   Differential diagnosis includes but is not limited to, abdominal perforation, aortic dissection, cholecystitis, appendicitis, diverticulitis, colitis, esophagitis/gastritis, kidney stone, pyelonephritis, urinary tract infection, aortic aneurysm. All are considered in decision and treatment plan. Based upon the patient's presentation and risk factors, suspect likely stone disease given clinical history.  Upon review of lab work and imaging, there is the patient has pain from a kidney stone.  It is not particularly large.  There is no associated evidence of infection.  He denies any infectious symptoms.  Pain is controlled  Discussed with the patient pain control options, he reports he is used Percocet with good success in the past. I will prescribe the patient a narcotic pain medicine due to their condition which I anticipate will cause at least moderate pain short term. I discussed with the patient safe use of narcotic pain medicines, and that they are not to drive or ever take more than prescribed (no more than 1 pill every 6 hours). We discussed that this is the type of medication that can be  overdosed on and the risks of this type of medicine. Patient is very agreeable to only use as prescribed and to never use more than prescribed.   ----------------------------------------- 2:16 PM on 08/22/2019 -----------------------------------------  Patient fully awake and alert, reports he is feeling much better and he would like to be able to go home now.  Return precautions and treatment recommendations and follow-up discussed with the patient who is agreeable with the plan.  He appears in no distress  reports his pain is much better     ____________________________________________   FINAL CLINICAL IMPRESSION(S) / ED DIAGNOSES  Final diagnoses:  Kidney stone on right side        Note:  This document was prepared using Dragon voice recognition software and may include unintentional dictation errors       Delman Kitten, MD 08/22/19 1416

## 2019-08-22 NOTE — ED Triage Notes (Signed)
Pt to ED via POV for RLQ abd pain that started this morning around 3 am. Pt states that he is also having nausea but no vomiting. Pt states that he does not have his appendix. Pt has hx/o kidney stones but this does not feel the same. Pt states that he has been having hot and cold chills.

## 2019-08-22 NOTE — ED Notes (Signed)
Up to bathroom

## 2019-08-22 NOTE — ED Notes (Signed)
Pt given cup for urine sample 

## 2019-08-22 NOTE — ED Notes (Signed)
Pt given warm blanket as requested. Call bell within reach, stretcher locked in lowest position

## 2019-08-22 NOTE — ED Notes (Signed)
See triage note. Pt c/o RLQ pain that started at 0300 this morning. Hx kidney stones. Reports nausea, flank pain. No hematuria.

## 2021-12-08 IMAGING — CT CT RENAL STONE PROTOCOL
2 of 4 series · 16 of 46 positions shown, 18 images · non-contrast
Comparison: CT abdomen pelvis 06/01/2019

CLINICAL DATA: Flank pain, kidney stone suspected

EXAM:
CT ABDOMEN AND PELVIS WITHOUT CONTRAST
TECHNIQUE: Multidetector CT imaging of the abdomen and pelvis was performed
following the standard protocol without IV contrast.

[Series 2: stone full standard · axial · 0.94mm/px · z∈[-1017,-467]mm · 13 of 122 slices shown, 15 images]
[im 6/122  soft-tissue]
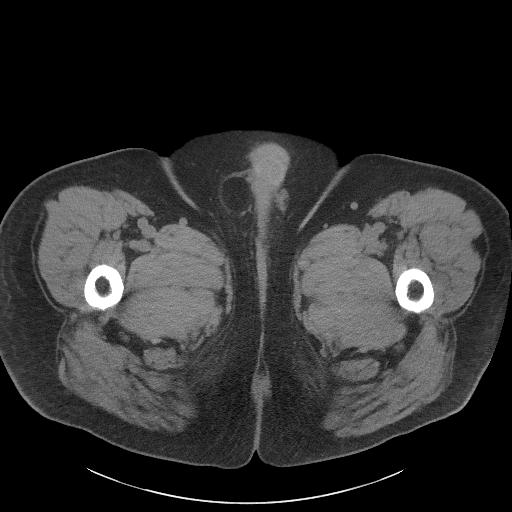
[im 6/122  bone]
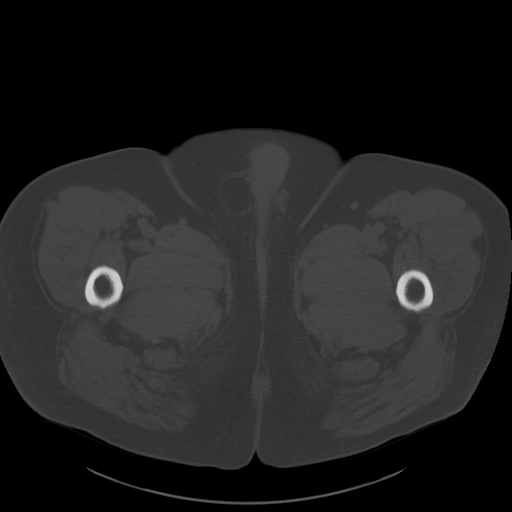
[im 16/122  soft-tissue]
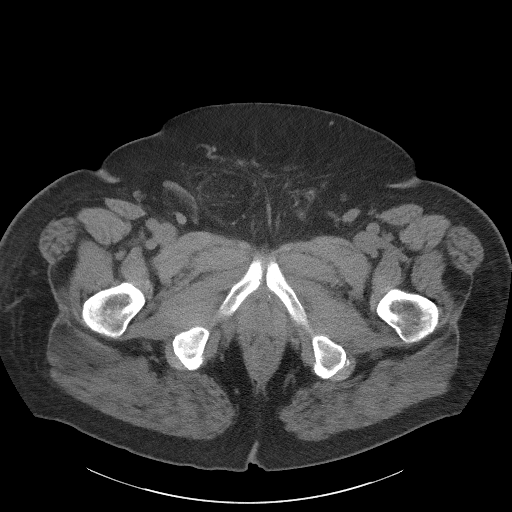
[im 26/122  soft-tissue]
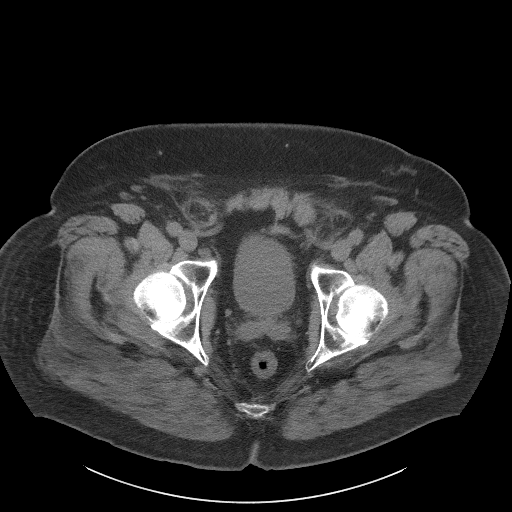
[im 36/122  soft-tissue]
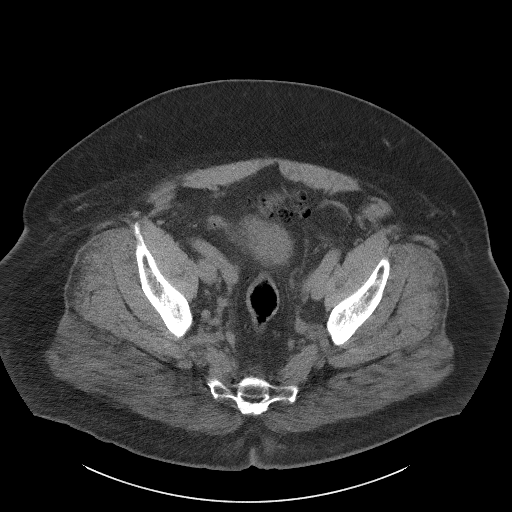
[im 41/122  soft-tissue]
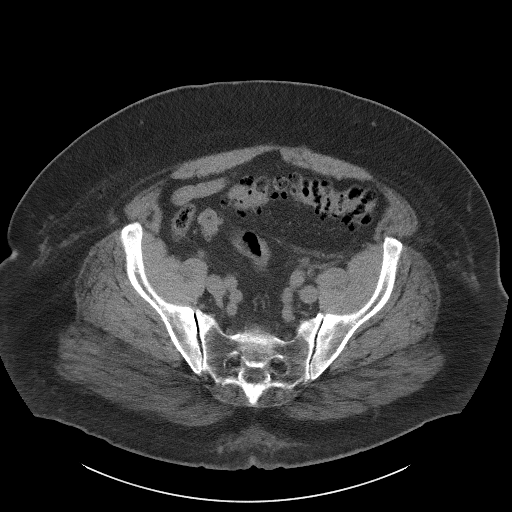
[im 51/122  soft-tissue]
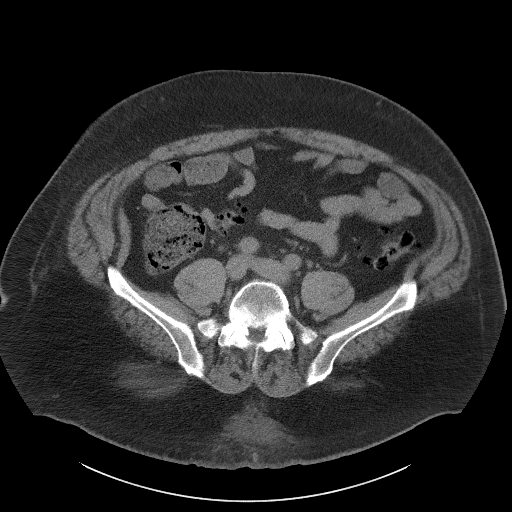
[im 61/122  soft-tissue]
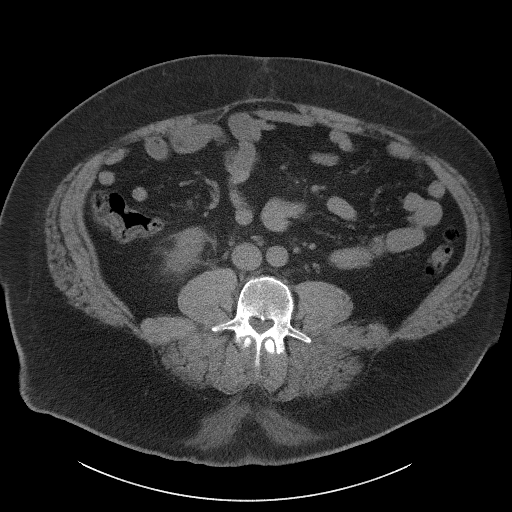
[im 71/122  soft-tissue]
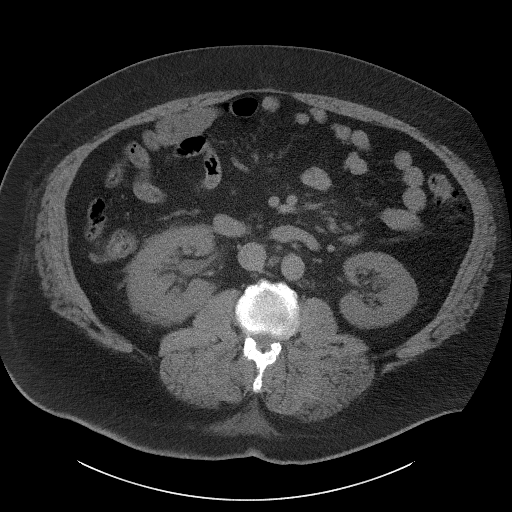
[im 81/122  soft-tissue]
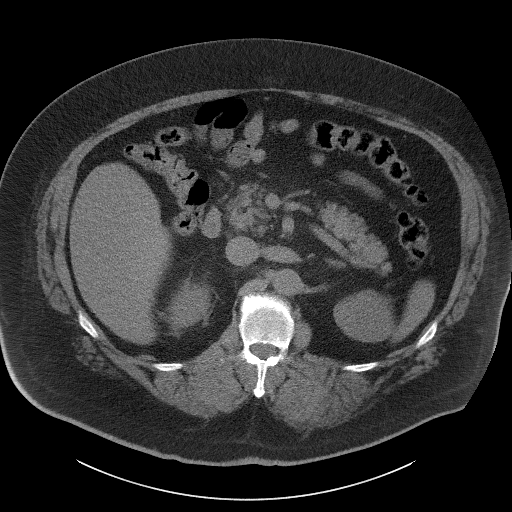
[im 81/122  bone]
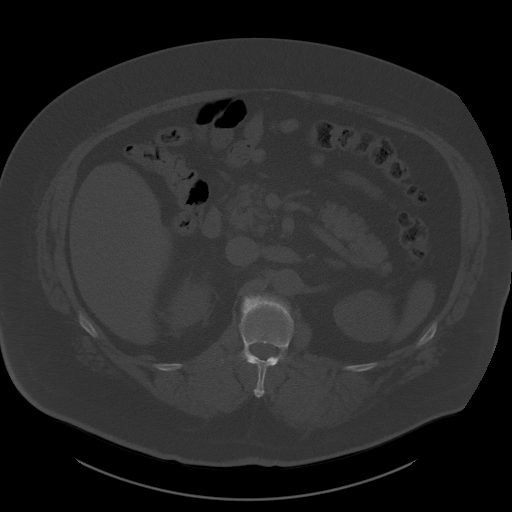
[im 86/122  soft-tissue]
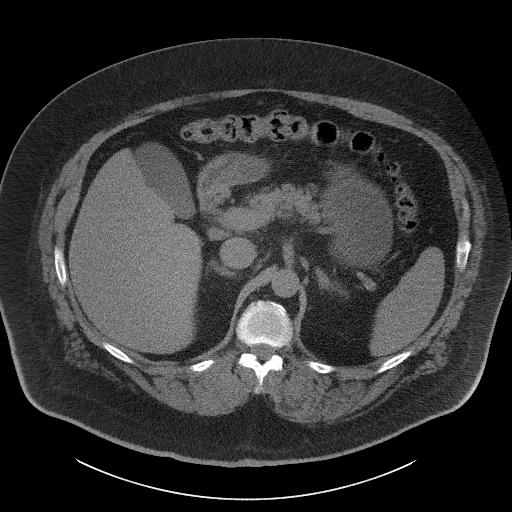
[im 96/122  soft-tissue]
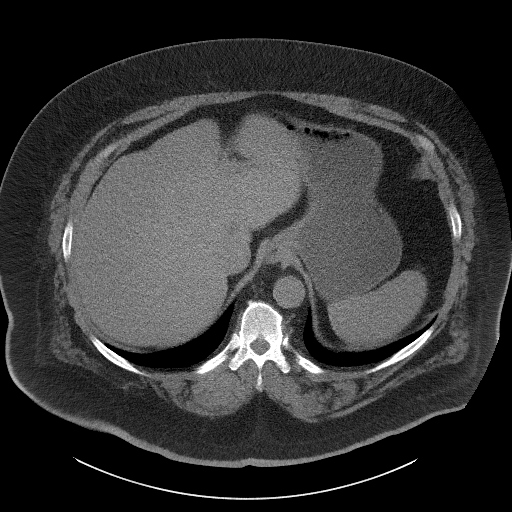
[im 106/122  soft-tissue]
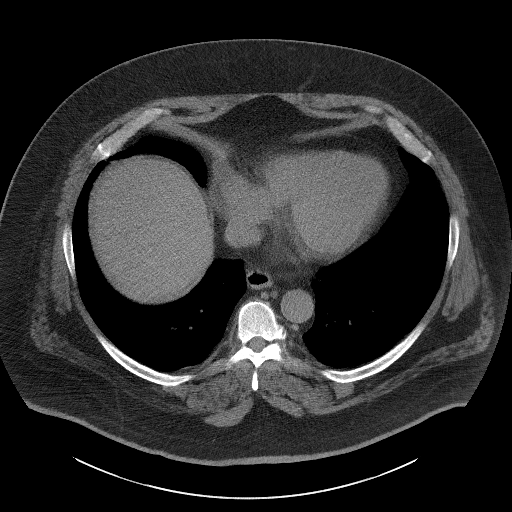
[im 116/122  soft-tissue]
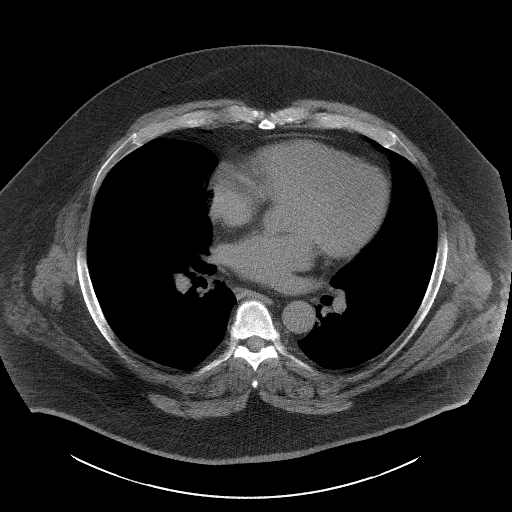

[Series 5: coronal · coronal · 0.98mm/px · 3 of 196 slices shown]
[im 66/196  soft-tissue]
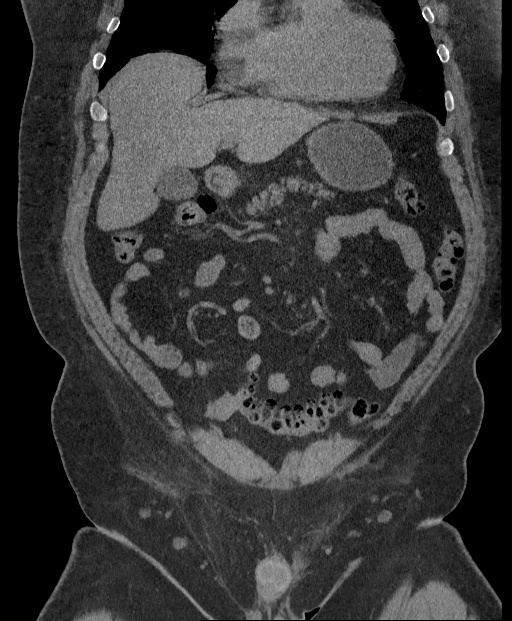
[im 87/196  soft-tissue]
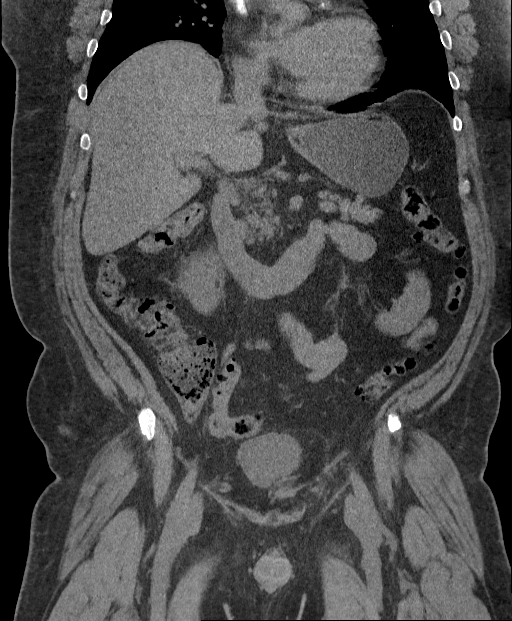
[im 109/196  soft-tissue]
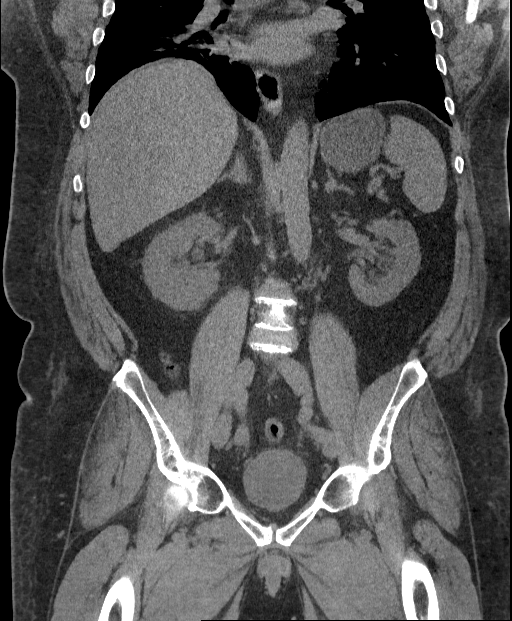

[16 of 46 positions shown; findings below may reference images not displayed]

FINDINGS: Lower chest: Multiple small nodules in the bilateral lung bases,
similar in appearance to the prior CT.

Evaluation of the abdominal viscera is somewhat limited by the lack
of IV contrast.

Hepatobiliary: No focal liver abnormality is seen. No gallstones,
gallbladder wall thickening, or biliary dilatation.

Pancreas: Unremarkable.

Spleen: Normal in size without focal abnormality.

Adrenals/Urinary Tract: Adrenal glands are unremarkable. The right
kidney is enlarged with perinephric fat stranding. There is mild
fullness of the right renal collecting system. There is a 3 mm
calculus in the distal right ureter just above the UVJ (series 5,
image 124). The left kidney is unremarkable. The urinary bladder is
normal in appearance.

Stomach/Bowel: Stomach is within normal limits. Appendix is
surgically absent. No evidence of bowel wall thickening, distention,
or inflammatory changes. Multiple colonic diverticula without
evidence of diverticulitis.

Vascular/Lymphatic: Minimal aortic atherosclerosis. Vascular patency
cannot be assessed in the absence of IV contrast. No enlarged
abdominal or pelvic lymph nodes.

Reproductive: Prostate is unremarkable.

Other: Bilateral right greater than left fat containing inguinal
hernias. Small fat containing umbilical hernia. No abdominopelvic
ascites.

Musculoskeletal: Degenerative disc disease at L5-S1 and at the
bilateral sacroiliac joints. No acute osseous finding.
IMPRESSION: 1. The right kidney is enlarged with perinephric fat stranding.
There is a 3 mm calculus in the distal right ureter just above the
UVJ. There is very mild associated right hydronephrosis and
ureterectasis.
2. Colonic diverticulosis without evidence of diverticulitis.
3. Bilateral fat containing inguinal hernias. Small fat containing
umbilical hernia.
4. Multiple small pulmonary nodules in the visualized lung bases,
similar in appearance to the prior CT.

## 2022-09-09 ENCOUNTER — Emergency Department (HOSPITAL_COMMUNITY): Payer: BLUE CROSS/BLUE SHIELD

## 2022-09-09 ENCOUNTER — Other Ambulatory Visit: Payer: Self-pay

## 2022-09-09 ENCOUNTER — Emergency Department (HOSPITAL_COMMUNITY)
Admission: EM | Admit: 2022-09-09 | Discharge: 2022-09-09 | Disposition: A | Discharge status: Home / Self Care | Payer: BLUE CROSS/BLUE SHIELD | Attending: Emergency Medicine | Admitting: Emergency Medicine

## 2022-09-09 ENCOUNTER — Encounter (HOSPITAL_COMMUNITY): Payer: Self-pay

## 2022-09-09 DIAGNOSIS — H547 Unspecified visual loss: Secondary | ICD-10-CM

## 2022-09-09 DIAGNOSIS — H538 Other visual disturbances: Secondary | ICD-10-CM | POA: Diagnosis present

## 2022-09-09 LAB — CBC WITH DIFFERENTIAL/PLATELET
Abs Immature Granulocytes: 0.04 10*3/uL (ref 0.00–0.07)
Basophils Absolute: 0 10*3/uL (ref 0.0–0.1)
Basophils Relative: 0 %
Eosinophils Absolute: 0.1 10*3/uL (ref 0.0–0.5)
Eosinophils Relative: 1 %
HCT: 39.6 % (ref 39.0–52.0)
Hemoglobin: 13.3 g/dL (ref 13.0–17.0)
Immature Granulocytes: 1 %
Lymphocytes Relative: 12 %
Lymphs Abs: 0.8 10*3/uL (ref 0.7–4.0)
MCH: 32 pg (ref 26.0–34.0)
MCHC: 33.6 g/dL (ref 30.0–36.0)
MCV: 95.4 fL (ref 80.0–100.0)
Monocytes Absolute: 0.5 10*3/uL (ref 0.1–1.0)
Monocytes Relative: 7 %
Neutro Abs: 5.5 10*3/uL (ref 1.7–7.7)
Neutrophils Relative %: 79 %
Platelets: 211 10*3/uL (ref 150–400)
RBC: 4.15 MIL/uL — ABNORMAL LOW (ref 4.22–5.81)
RDW: 12.8 % (ref 11.5–15.5)
WBC: 7 10*3/uL (ref 4.0–10.5)
nRBC: 0 % (ref 0.0–0.2)

## 2022-09-09 LAB — BASIC METABOLIC PANEL
Anion gap: 11 (ref 5–15)
BUN: 13 mg/dL (ref 8–23)
CO2: 24 mmol/L (ref 22–32)
Calcium: 9 mg/dL (ref 8.9–10.3)
Chloride: 106 mmol/L (ref 98–111)
Creatinine, Ser: 1.03 mg/dL (ref 0.61–1.24)
GFR, Estimated: >60 mL/min (ref >60)
Glucose, Bld: 113 mg/dL — ABNORMAL HIGH (ref 70–99)
Potassium: 3.6 mmol/L (ref 3.5–5.1)
Sodium: 141 mmol/L (ref 135–145)

## 2022-09-09 MED ORDER — LACTATED RINGERS IV SOLN: INTRAVENOUS | Status: DC

## 2022-09-09 MED ORDER — PREDNISONE 10 MG PO TABS: 20.0000 mg | ORAL_TABLET | Freq: Every day | ORAL | 0 refills | Status: DC

## 2022-09-09 NOTE — ED Triage Notes (Signed)
C/o feeling like something is in right eye and having problems with peripheral vision to right eye and sent by dr Delana Meyer for MRI of brain.

## 2022-09-09 NOTE — Discharge Instructions (Signed)
Keep your appointment with Dr. Hanley Ben for Thursday return here if you have any vision loss

## 2022-09-09 NOTE — ED Notes (Signed)
Dr Delana Meyer with [facility] called stating he is sending pt for "STAT" MRI of Brain w/wo contrast. Message was left with secretary, unclear as to what symptoms pt is having.

## 2022-09-09 NOTE — ED Provider Notes (Signed)
[place] EMERGENCY DEPARTMENT AT [facility] Provider Note   CSN: [id number] Arrival date & time: 09/09/22  1153     History  Chief Complaint  Patient presents with   Eye Problem    Tiago Rothberg is a 62 y.o. male.  62 year old male presents with right visual changes times several days.  Patient went to his optometrist today due to having decreased visual acuity in the right eye described as seeing through a gray lens.  Denies any actual visual loss.  Denies any associated focal neurological deficits.  No headaches noted.  No emesis.  Optometrist cannot see patient's retina and concern for possible mass.  Was sent to the ED to have an MRI.  Patient denies any prior history of issues       Home Medications Prior to Admission medications   Medication Sig Start Date End Date Taking? Authorizing Provider  atorvastatin (LIPITOR) 20 MG tablet Take 20 mg by mouth at bedtime.  08/07/19   [provider]  benzonatate (TESSALON) 200 MG capsule Take 1 capsule (200 mg total) by mouth 3 (three) times daily as needed for cough. Patient not taking: Reported on 08/22/2019 09/12/14   Elvina Sidle, MD  cetirizine (ZYRTEC) 10 MG tablet Take 10 mg by mouth at bedtime.     [provider]  levofloxacin (LEVAQUIN) 500 MG tablet Take 1 tablet (500 mg total) by mouth daily. Patient not taking: Reported on 08/22/2019 09/12/14   Elvina Sidle, MD  losartan (COZAAR) 25 MG tablet Take 25 mg by mouth daily. 06/25/19   [provider]  metFORMIN (GLUCOPHAGE) 500 MG tablet Take 500 mg by mouth 2 (two) times daily. 08/04/19   [provider]  ondansetron (ZOFRAN ODT) 4 MG disintegrating tablet Take 1 tablet (4 mg total) by mouth every 6 (six) hours as needed for nausea or vomiting. 08/22/19   Sharyn Creamer, MD  oxyCODONE-acetaminophen (PERCOCET/ROXICET) 5-325 MG tablet Take 1 tablet by mouth every 6 (six) hours as needed for severe pain. 08/22/19   Sharyn Creamer, MD   tamsulosin (FLOMAX) 0.4 MG CAPS capsule Take 1 capsule (0.4 mg total) by mouth daily. 08/22/19   Sharyn Creamer, MD      Allergies    Iodinated contrast media, Shellfish-derived products, Codeine, Shellfish allergy, and Vicodin [hydrocodone-acetaminophen]    Review of Systems   Review of Systems  All other systems reviewed and are negative.   Physical Exam Updated Vital Signs BP (!) 159/83 (BP Location: Left Arm)   Pulse 76   Temp 97.7 F (36.5 C) (Oral)   Resp 19   Wt (!) 186 kg   SpO2 95%   BMI 51.25 kg/m  Physical Exam Vitals and nursing note reviewed.  Constitutional:      General: He is not in acute distress.    Appearance: Normal appearance. He is well-developed. He is not toxic-appearing.  HENT:     Head: Normocephalic and atraumatic.  Eyes:     General: Lids are normal.     Conjunctiva/sclera: Conjunctivae normal.     Pupils: Pupils are equal, round, and reactive to light.  Neck:     Thyroid: No thyroid mass.     Trachea: No tracheal deviation.  Cardiovascular:     Rate and Rhythm: Normal rate and regular rhythm.     Heart sounds: Normal heart sounds. No murmur heard.    No gallop.  Pulmonary:     Effort: Pulmonary effort is normal. No respiratory distress.  Breath sounds: Normal breath sounds. No stridor. No decreased breath sounds, wheezing, rhonchi or rales.  Abdominal:     General: There is no distension.     Palpations: Abdomen is soft.     Tenderness: There is no abdominal tenderness. There is no rebound.  Musculoskeletal:        General: No tenderness. Normal range of motion.     Cervical back: Normal range of motion and neck supple.  Skin:    General: Skin is warm and dry.     Findings: No abrasion or rash.  Neurological:     Mental Status: He is alert and oriented to person, place, and time. Mental status is at baseline.     GCS: GCS eye subscore is 4. GCS verbal subscore is 5. GCS motor subscore is 6.     Cranial Nerves: No cranial nerve  deficit.     Sensory: No sensory deficit.     Motor: Motor function is intact.  Psychiatric:        Attention and Perception: Attention normal.        Speech: Speech normal.        Behavior: Behavior normal.     ED Results / Procedures / Treatments   Labs (all labs ordered are listed, but only abnormal results are displayed) Labs Reviewed - No data to display  EKG None  Radiology No results found.  Procedures Procedures    Medications Ordered in ED Medications  lactated ringers infusion (has no administration in time range)    ED Course/ Medical Decision Making/ A&P                             Medical Decision Making Amount and/or Complexity of Data Reviewed Labs: ordered. Radiology: ordered.  Risk Prescription drug management.   Patient had MRI of his brain without contrast which showed no acute findings per my review as well as radiology.  Discussed case with patient's optometrist Dr. Hanley Ben who states that patient is to start oral course of prednisone here.  She will see the patient in 3 days.        Final Clinical Impression(s) / ED Diagnoses Final diagnoses:  None    Rx / DC Orders ED Discharge Orders     None         Lorre Nick, MD 09/09/22 1529

## 2022-09-17 ENCOUNTER — Encounter: Payer: Self-pay | Admitting: Neurology

## 2022-09-24 NOTE — Progress Notes (Unsigned)
Initial neurology clinic note  Adam Gregory MRN: [id number] DOB: 1960/12/25  Referring provider: Russella Dar, OD  Primary care provider: System, Provider Not In  Reason for consult:  vision changes  Subjective:  This is [name], a 62 y.o. right-handed male with a medical history of HTN, HLD, DM, OSA (on CPAP), and rectal cancer who presents to neurology clinic with right visual changes. The patient is alone today.  Patient first had symptoms on 09/05/22. He felt like his right contact was bothering him. There was no pain, but he thought there was something on his contact because he had visual changes. Despite taking out contacts, he still had symptoms. He did not know if this was a scratched cornea or floater. He would close his left Gregory and not be able to see in his lower quadrants of the right Gregory (grayed, but still able to see some). Closing the right Gregory, would have no symptoms.  He saw [facility] on 09/09/22. Right optic nerve appeared to be swollen with inferior visual field deficit. Visual acuity was 20/20 in right Gregory. Patient was sent to the ED for MRI. MRI showed no acute process and normal appearance of orbits. Adam Gregory recommended oral prednisone, but they decided not to do this and patient never took it. Patient was then referred to neurology. PCP referred patient to retina specialist and cardiologist.  Cardiologist recommended asa 81 mg daily. Patient has not started this yet. He was to get an echo and 30 day heart monitor. He is also getting a chemical stress test.  Strong family history of NAION, father in one Gregory, sister in one Gregory, and brother in both eyes.  [name]. [name], numbness, etc.  [name]  MEDICATIONS:  Outpatient Encounter Medications as of 09/26/2022  Medication Sig   atorvastatin (LIPITOR) 20 MG tablet Take  20 mg by mouth at bedtime.    cetirizine (ZYRTEC) 10 MG tablet Take 10 mg by mouth at bedtime.    losartan (COZAAR) 25 MG tablet Take 25 mg by mouth daily.   ondansetron (ZOFRAN ODT) 4 MG disintegrating tablet Take 1 tablet (4 mg total) by mouth every 6 (six) hours as needed for nausea or vomiting. (Patient not taking: Reported on 09/26/2022)   tamsulosin (FLOMAX) 0.4 MG CAPS capsule Take 1 capsule (0.4 mg total) by mouth daily. (Patient not taking: Reported on 09/26/2022)   [DISCONTINUED] benzonatate (TESSALON) 200 MG capsule Take 1 capsule (200 mg total) by mouth 3 (three) times daily as needed for cough. (Patient not taking: Reported on 08/22/2019)   [DISCONTINUED] levofloxacin (LEVAQUIN) 500 MG tablet Take 1 tablet (500 mg total) by mouth daily. (Patient not taking: Reported on 08/22/2019)   [DISCONTINUED] metFORMIN (GLUCOPHAGE) 500 MG tablet Take 500 mg by mouth 2 (two) times daily.   [DISCONTINUED] oxyCODONE-acetaminophen (PERCOCET/ROXICET) 5-325 MG tablet Take 1 tablet by mouth every 6 (six) hours as needed for severe pain.   [DISCONTINUED] predniSONE (DELTASONE) 10 MG tablet Take 2 tablets (20 mg total) by mouth daily.   No facility-administered encounter medications on file as of 09/26/2022.    PAST MEDICAL HISTORY: Past Medical History:  Diagnosis Date   Allergy    Cancer    DM2 (diabetes mellitus, type 2)    HLD (hyperlipidemia)    HTN (hypertension)    OSA (obstructive sleep apnea)    Rectal cancer  PAST SURGICAL HISTORY: Past Surgical History:  Procedure Laterality Date   APPENDECTOMY     HERNIA REPAIR      ALLERGIES: Allergies  Allergen Reactions   Iodinated Contrast Media Hives, Itching and Other (See Comments)    Sends into cardiac arrest  Anaphylaxis  Sends into cardiac arrest, Anaphylaxis  Sends into cardiac arrest   Shellfish-Derived Products Anaphylaxis   Codeine Nausea And Vomiting   Shellfish Allergy Hives   Vicodin [Hydrocodone-Acetaminophen]      FAMILY HISTORY: History reviewed. No pertinent family history.  SOCIAL HISTORY: Social History   Tobacco Use   Smoking status: Never  Vaping Use   Vaping Use: Never used  Substance Use Topics   Alcohol use: Yes    Alcohol/week: 1.0 standard drink of alcohol    Types: 1 Standard drinks or equivalent per week   Drug use: No   Social History   Social History Narrative   Are you right handed or left handed? Right   Are you currently employed ?    What is your current occupation?   Do you live at home alone?yes   Who lives with you?    What type of home do you live in: 1 story or 2 story? one    Caffeine 1 cup day    Objective:  Vital Signs:  BP 129/64   Pulse 72   Ht 6\' 3"  (1.905 m)   Wt (!) 373 lb (169.2 kg)   SpO2 96%   BMI 46.62 kg/m   General: No acute distress.  Patient appears well-groomed.   Head:  Normocephalic/atraumatic Eyes:  fundi examined but not able to be visualized well Neck: supple Heart: regular rate and rhythm Lungs: Clear to auscultation bilaterally. Vascular: No carotid bruits.  Neurological Exam: Mental status: alert and oriented, speech fluent and not dysarthric, language intact.  Cranial nerves: CN I: not tested CN II: pupils equal, round and reactive to light, visual fields diminished in lower quadrants of right Gregory, otherwise intact. Visual acuity is 20/20 in OD and OS with contacts in. CN III, IV, VI:  full range of motion, no nystagmus, no ptosis CN V: facial sensation intact. CN VII: upper and lower face symmetric CN VIII: hearing intact CN IX, X: gag intact, uvula midline CN XI: sternocleidomastoid and trapezius muscles intact CN XII: tongue midline  Bulk & Tone: normal, no fasciculations. Motor:  muscle strength 5/5 throughout, except in left DF (4+/5) Deep Tendon Reflexes:  2+ throughout,  toes downgoing.   Sensation:  Sensation intact to light touch. Finger to nose testing:  Without dysmetria.   Gait:  Antalgic gait  (left knee pain)   Labs and Imaging review: Internal labs: 09/09/22: CBC and BMP unremarkable  External labs: B12 (08/27/22): 250 A1c (06/21/22): 5.7  Imaging: MRI brain wo contrast (09/09/22): FINDINGS: Brain: Negative for an acute infarct. No hemorrhage. No extra-axial fluid collection. Sequela of mild chronic microvascular ischemic change. No hydrocephalus.   Vascular: Normal flow voids.   Skull and upper cervical spine: Normal marrow signal.   Sinuses/Orbits: No middle ear or mastoid effusion. Paranasal sinuses are clear. Orbits are unremarkable.   Other: None.   IMPRESSION: No acute intracranial process. Normal noncontrast appearance of the orbits.  Assessment/Plan:  Adam Gregory is a 62 y.o. male who presents for evaluation of lower quadrant of right Gregory visual changes. He has a relevant medical history of HTN, HLD, DM, OSA (on CPAP), and rectal cancer. His neurological examination is pertinent for diminished  visual acuity/field cut in lower quadrants of right Gregory. Available diagnostic data is significant for MRI brain showing no acute process. Right optic nerve appeared swollen per Adam Gregory evaluation. 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 Patient does not have any associated symptoms to suggest AION, but I will send lab work to evaluate for inflammation. Retinal artery or vein occlusion is possible, but perhaps less likely. I will work up as below.  PLAN: -Blood work: TSH, ESR, CRP, ANA, ENA -Carotid ultrasound (as patient has iodine allergy and cannot get CTA with contrast) -Recommend B12 supplement 1000 mcg daily -Discussed controlling vascular risk factors including HTN, HLD, and DM. 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 All questions were answered to their satisfaction.  When available, results of the above investigations and possible further recommendations will be communicated to the patient via telephone/MyChart. Patient to call office if not contacted after expected testing turnaround time.   Total time spent reviewing records, interview, history/exam, documentation, and coordination of care on day of encounter:  50 min   Thank you for allowing me to participate in patient's care.  If I can answer any additional questions, I would be pleased to do so.  Jacquelyne Balint, MD   CC: System, Provider Not In No address on file  CC: Referring provider: Russella Dar, OD [address] [place],  [place] [address]

## 2022-09-26 ENCOUNTER — Other Ambulatory Visit (INDEPENDENT_AMBULATORY_CARE_PROVIDER_SITE_OTHER): Payer: BLUE CROSS/BLUE SHIELD

## 2022-09-26 ENCOUNTER — Encounter: Payer: Self-pay | Admitting: Neurology

## 2022-09-26 ENCOUNTER — Ambulatory Visit (INDEPENDENT_AMBULATORY_CARE_PROVIDER_SITE_OTHER): Payer: BLUE CROSS/BLUE SHIELD | Admitting: Neurology

## 2022-09-26 VITALS — BP 129/64 | HR 72 | Ht 75.0 in | Wt 373.0 lb

## 2022-09-26 DIAGNOSIS — I10 Essential (primary) hypertension: Secondary | ICD-10-CM | POA: Diagnosis not present

## 2022-09-26 DIAGNOSIS — H53481 Generalized contraction of visual field, right eye: Secondary | ICD-10-CM

## 2022-09-26 DIAGNOSIS — E785 Hyperlipidemia, unspecified: Secondary | ICD-10-CM

## 2022-09-26 DIAGNOSIS — H47011 Ischemic optic neuropathy, right eye: Secondary | ICD-10-CM

## 2022-09-26 DIAGNOSIS — G4733 Obstructive sleep apnea (adult) (pediatric): Secondary | ICD-10-CM

## 2022-09-26 DIAGNOSIS — E119 Type 2 diabetes mellitus without complications: Secondary | ICD-10-CM

## 2022-09-26 LAB — TSH: TSH: 1.38 u[IU]/mL (ref 0.35–5.50)

## 2022-09-26 LAB — C-REACTIVE PROTEIN: CRP: <1 mg/dL (ref 0.5–20.0)

## 2022-09-26 LAB — SEDIMENTATION RATE: Sed Rate: 40 mm/hr — ABNORMAL HIGH (ref 0–20)

## 2022-09-26 NOTE — Patient Instructions (Addendum)
I saw you today for the visual changes in the right eye lower quadrants. 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 A retinal artery/vein occlusion is possible, but usually is associated with more visual loss.  I want to evaluate more with the following: -Blood work today -Carotid ultrasound to evaluate for any blockages in carotid arteries  I will be in touch when I have your results.  Your B12 was borderline low. Given your symptoms, I would recommend supplementing with B12 1000 mcg daily. This can be bought over the counter at any local drug store or online.   The physicians and staff at [facility] are committed to providing excellent care. You may receive a survey requesting feedback about your experience at our office. We strive to receive "very good" responses to the survey questions. If you feel that your experience would prevent you from giving the office a "very good " response, please contact our office to try to remedy the situation. We may be reached at [phone]. Thank you for taking the time out of your busy day to complete the survey.  Jacquelyne Balint, MD [facility]

## 2022-09-27 LAB — ANA+ENA+DNA/DS+SCL 70+SJOSSA/B
ENA SM Ab Ser-aCnc: <0.2 AI (ref 0.0–0.9)
ENA SSA (RO) Ab: <0.2 AI (ref 0.0–0.9)
Scleroderma (Scl-70) (ENA) Antibody, IgG: <0.2 AI (ref 0.0–0.9)

## 2022-09-28 LAB — ANA+ENA+DNA/DS+SCL 70+SJOSSA/B
ANA Titer 1: NEGATIVE
ENA RNP Ab: <0.2 AI (ref 0.0–0.9)
ENA SSB (LA) Ab: <0.2 AI (ref 0.0–0.9)
dsDNA Ab: <1 IU/mL (ref 0–9)

## 2022-10-17 ENCOUNTER — Ambulatory Visit: Payer: BLUE CROSS/BLUE SHIELD | Admitting: Neurology

## 2022-11-04 ENCOUNTER — Ambulatory Visit
Admission: RE | Admit: 2022-11-04 | Discharge: 2022-11-04 | Disposition: A | Discharge status: Home / Self Care | Payer: BLUE CROSS/BLUE SHIELD | Source: Ambulatory Visit | Attending: Neurology | Admitting: Neurology

## 2022-11-04 DIAGNOSIS — H47011 Ischemic optic neuropathy, right eye: Secondary | ICD-10-CM

## 2022-11-04 DIAGNOSIS — I10 Essential (primary) hypertension: Secondary | ICD-10-CM

## 2022-11-04 DIAGNOSIS — H53481 Generalized contraction of visual field, right eye: Secondary | ICD-10-CM

## 2022-11-04 DIAGNOSIS — G4733 Obstructive sleep apnea (adult) (pediatric): Secondary | ICD-10-CM

## 2022-11-04 DIAGNOSIS — E785 Hyperlipidemia, unspecified: Secondary | ICD-10-CM

## 2022-11-04 DIAGNOSIS — E119 Type 2 diabetes mellitus without complications: Secondary | ICD-10-CM

## 2024-01-06 ENCOUNTER — Other Ambulatory Visit: Payer: Self-pay | Admitting: Surgery

## 2024-01-15 ENCOUNTER — Ambulatory Visit: Payer: Self-pay | Admitting: General Surgery

## 2024-01-15 NOTE — H&P (Signed)
 Chief Complaint: New Problem  (small bowel containing right ing hernia, discuss lap or robotic SX )       History of Present Illness: Adam Gregory is a 63 y.o. male who is seen today as an office consultation at the request of Dr. [facility] for evaluation of New Problem  (small bowel containing right ing hernia, discuss lap or robotic SX ) .   History of Present Illness Adam Gregory is a 63 year old male with a history of inguinal hernias who presents with concerns about a recurrent hernia. He was referred by Dr. Vernetta for evaluation of his hernia.   He experienced a bulge in the right inguinal area after consuming red meat, initially attributing it to dietary changes. He later identified it as related to his hernia, with associated nausea and pain. He was unable to reduce the hernia himself, leading to medical intervention where Dilaudid was administered, and the hernia was reduced without surgery. No recurrence of the hernia since that intervention.   He has a history of two right inguinal hernias, both previously repaired with open incisions, and a left inguinal hernia. In the past, he could manually reduce the hernias, but not this time.   He is allergic to codeine and Vicodin, tolerating Percocet well for pain management. He is on Mounjaro for weight management, having lost 125 pounds. He lives alone and plans for his brother to assist post-surgery, traveling from [facility]. No current pain, bulging, or symptoms related to his hernia.       Review of Systems: A complete review of systems was obtained from the patient.  I have reviewed this information and discussed as appropriate with the patient.  See HPI as well for other ROS.   Review of Systems  Constitutional:  Negative for fever.  HENT:  Negative for congestion.   Eyes:  Negative for blurred vision.  Respiratory:  Negative for cough, shortness of breath and wheezing.   Cardiovascular:  Negative for chest pain  and palpitations.  Gastrointestinal:  Negative for heartburn.  Genitourinary:  Negative for dysuria.  Musculoskeletal:  Negative for myalgias.  Skin:  Negative for rash.  Neurological:  Negative for dizziness and headaches.  Psychiatric/Behavioral:  Negative for depression and suicidal ideas.   All other systems reviewed and are negative.       Medical History: Past Medical History Past Medical History: Diagnosis Date  History of cancer         Problem List There is no problem list on file for this patient.     Past Surgical History Past Surgical History: Procedure Laterality Date  APPENDECTOMY      HERNIA REPAIR      squamous cell carcinoma          Allergies Allergies Allergen Reactions  Hydrocodone-Acetaminophen  Itching and Other (See Comments)  Iodinated Contrast Media Hives, Itching and Other (See Comments)     Sends into cardiac arrest Anaphylaxis    Sends into cardiac arrest, Anaphylaxis    Sends into cardiac arrest  Codeine Other (See Comments) and Nausea And Vomiting  Shellfish Containing Products Hives      Medications Ordered Prior to Encounter Current Outpatient Medications on File Prior to Visit Medication Sig Dispense Refill  amLODIPine (NORVASC) 5 MG tablet Take 5 mg by mouth once daily      atorvastatin (LIPITOR) 20 MG tablet Take 20 mg by mouth once daily      blood-glucose sensor (DEXCOM G7 SENSOR) Devi APPLY 1 EACH TOPICALLY  EVERY 10 DAYS. TO MONITOR BLOOD SUGAR      cetirizine (ZYRTEC) 10 MG tablet Take 10 mg by mouth once daily      hydroCHLOROthiazide (HYDRODIURIL) 12.5 MG tablet Take 12.5 mg by mouth once daily      losartan (COZAAR) 100 MG tablet Take 100 mg by mouth once daily      metoprolol TARTrate (LOPRESSOR) 25 MG tablet Take 12.5 mg by mouth      MOUNJARO 15 mg/0.5 mL pen injector Inject 15 mg subcutaneously every 7 (seven) days      semaglutide (OZEMPIC) 1 mg/dose (4 mg/3 mL) pen injector Inject 1 mg subcutaneously every 7  (seven) days        No current facility-administered medications on file prior to visit.      Family History Family History Problem Relation Age of Onset  Hyperlipidemia (Elevated cholesterol) Mother    High blood pressure (Hypertension) Mother    Diabetes Father        Tobacco Use History Social History    Tobacco Use Smoking Status Never Smokeless Tobacco Never      Social History Social History    Socioeconomic History  Marital status: Single Tobacco Use  Smoking status: Never  Smokeless tobacco: Never Vaping Use  Vaping status: Never Used Substance and Sexual Activity  Alcohol use: Never  Drug use: Never  Sexual activity: Defer    Social Drivers of Health    Financial Resource Strain: Low Risk  (11/20/2023)   Received from [facility]   Overall Financial Resource Strain (CARDIA)    Difficulty of Paying Living Expenses: Not hard at all Food Insecurity: No Food Insecurity (11/20/2023)   Received from [facility]   Hunger Vital Sign    Within the past 12 months, you worried that your food would run out before you got the money to buy more.: Never true    Within the past 12 months, the food you bought just didn't last and you didn't have money to get more.: Never true Transportation Needs: No Transportation Needs (11/20/2023)   Received from [facility] - Transportation    Lack of Transportation (Medical): No    Lack of Transportation (Non-Medical): No Physical Activity: Insufficiently Active (11/20/2023)   Received from [facility]   Exercise Vital Sign    On average, how many days per week do you engage in moderate to strenuous exercise (like a brisk walk)?: 2 days    On average, how many minutes do you engage in exercise at this level?: 20 min Stress: No Stress Concern Present (11/20/2023)   Received from [facility] - Occupational Stress Questionnaire    Feeling of Stress : Not at  all Social Connections: Socially Integrated (11/20/2023)   Received from [facility]   Social Network    How would you rate your social network (family, work, friends)?: Good participation with social networks Housing Stability: Unknown (01/06/2024)   Housing Stability Vital Sign    Homeless in the Last Year: No      Objective:     Vitals:   01/15/24 1431 PainSc: 0-No pain   There is no height or weight on file to calculate BMI. Physical Exam Constitutional:      Appearance: Normal appearance.  HENT:     Head: Normocephalic and atraumatic.     Nose: Nose normal. No congestion.     Mouth/Throat:     Mouth: Mucous membranes are moist.  Pharynx: Oropharynx is clear.  Eyes:     Pupils: Pupils are equal, round, and reactive to light.  Cardiovascular:     Rate and Rhythm: Normal rate and regular rhythm.     Pulses: Normal pulses.     Heart sounds: Normal heart sounds. No murmur heard.    No friction rub. No gallop.  Pulmonary:     Effort: Pulmonary effort is normal. No respiratory distress.     Breath sounds: Normal breath sounds. No stridor. No wheezing, rhonchi or rales.  Abdominal:     General: Abdomen is flat.     Hernia: A hernia is present. Hernia is present in the left inguinal area and right inguinal area.  Musculoskeletal:        General: Normal range of motion.     Cervical back: Normal range of motion.  Skin:    General: Skin is warm and dry.  Neurological:     General: No focal deficit present.     Mental Status: He is alert and oriented to person, place, and time.  Psychiatric:        Mood and Affect: Mood normal.        Thought Content: Thought content normal.         Assessment and Plan: Diagnoses and all orders for this visit:   Unilateral recurrent inguinal hernia without obstruction or gangrene     Adam Gregory is a 63 y.o. male    1.  We will proceed to the OR for a ROBOTIC RIGHT inguinal hernia repair with mesh. 2. All risks and  benefits were discussed with the patient, to generally include infection, bleeding, damage to surrounding structures, acute and chronic nerve pain, and recurrence. Alternatives were offered and described.  All questions were answered and the patient voiced understanding of the procedure and wishes to proceed at this point.             No follow-ups on file.   Lynda Leos, MD, [facility], [place] [facility]

## 2024-03-04 NOTE — Patient Instructions (Addendum)
 SURGICAL WAITING ROOM VISITATION  Patients having surgery or a procedure may have no more than 2 support people in the waiting area - these visitors may rotate.    Children under the age of 75 must have an adult with them who is not the patient.  Visitors with respiratory illnesses are discouraged from visiting and should remain at home.  If the patient needs to stay at the hospital during part of their recovery, the visitor guidelines for inpatient rooms apply. Pre-op nurse will coordinate an appropriate time for 1 support person to accompany patient in pre-op.  This support person may not rotate.    Please refer to the [facility] website for the visitor guidelines for Inpatients (after your surgery is over and you are in a regular room).       Your procedure is scheduled on: 03/22/24   Report to [facility] Main Entrance    Report to admitting at 5:15 AM   Call this number if you have problems the morning of surgery [phone]   Do not eat food :After Midnight.   After Midnight you may have the following liquids until 4:30 AM DAY OF SURGERY  Water Non-Citrus Juices (without pulp, NO RED-Apple, White grape, White cranberry) Black Coffee (NO MILK/CREAM OR CREAMERS, sugar ok)  Clear Tea (NO MILK/CREAM OR CREAMERS, sugar ok) regular and decaf                             Plain Jell-O (NO RED)                                           Fruit ices (not with fruit pulp, NO RED)                                     Popsicles (NO RED)                                                               Sports drinks like Gatorade (NO RED)                  The day of surgery:  Drink ONE (1) Pre-Surgery  G2 at  4:30 AM the morning of surgery. Drink in one sitting. Do not sip.  This drink was given to you during your hospital  pre-op appointment visit. Nothing else to drink after completing the  Pre-Surgery G2.            Oral Hygiene is also important to reduce your risk of  infection.                                    Remember - BRUSH YOUR TEETH THE MORNING OF SURGERY WITH YOUR REGULAR TOOTHPASTE  DENTURES WILL BE REMOVED PRIOR TO SURGERY PLEASE DO NOT APPLY Poly grip OR ADHESIVES!!!   Do NOT smoke after Midnight   Stop all vitamins and herbal supplements 7 days before surgery.   Take these  medicines the morning of surgery with A SIP OF WATER: amlodipine, Cetirizine  DO NOT TAKE ANY ORAL DIABETIC MEDICATIONS DAY OF YOUR SURGERY Hold Mounjaro for at least 7 days prior to surgery.  Bring CPAP mask and tubing day of surgery.                              You may not have any metal on your body including hair pins, jewelry, and body piercing             Do not wear make-up, lotions, powders, perfumes/cologne, or deodorant               Men may shave face and neck.   Do not bring valuables to the hospital. [facility] IS NOT             RESPONSIBLE   FOR VALUABLES.   Contacts, glasses, dentures or bridgework may not be worn into surgery.  DO NOT BRING YOUR HOME MEDICATIONS TO THE HOSPITAL. PHARMACY WILL DISPENSE MEDICATIONS LISTED ON YOUR MEDICATION LIST TO YOU DURING YOUR ADMISSION IN THE HOSPITAL!    Patients discharged on the day of surgery will not be allowed to drive home.  Someone NEEDS to stay with you for the first 24 hours after anesthesia.   Special Instructions: Bring a copy of your healthcare power of attorney and living will documents the day of surgery if you haven't scanned them before.              Please read over the following fact sheets you were given: IF YOU HAVE QUESTIONS ABOUT YOUR PRE-OP INSTRUCTIONS PLEASE CALL [phone] Adam Gregory   If you received a COVID test during your pre-op visit  it is requested that you wear a mask when out in public, stay away from anyone that may not be feeling well and notify your surgeon if you develop symptoms. If you test positive for Covid or have been in contact with anyone that has tested positive  in the last 10 days please notify you surgeon.    [facility] - Preparing for Surgery Before surgery, you can play an important role.  Because skin is not sterile, your skin needs to be as free of germs as possible.  You can reduce the number of germs on your skin by washing with CHG (chlorahexidine gluconate) soap before surgery.  CHG is an antiseptic cleaner which kills germs and bonds with the skin to continue killing germs even after washing. Please DO NOT use if you have an allergy to CHG or antibacterial soaps.  If your skin becomes reddened/irritated stop using the CHG and inform your nurse when you arrive at Short Stay. Do not shave (including legs and underarms) for at least 48 hours prior to the first CHG shower.  You may shave your face/neck.  Please follow these instructions carefully:  1.  Shower with CHG Soap the night before surgery ONLY (DO NOT USE THE SOAP THE MORNING OF SURGERY).  2.  If you choose to wash your hair, wash your hair first as usual with your normal  shampoo.  3.  After you shampoo, rinse your hair and body thoroughly to remove the shampoo.                             4.  Use CHG as you would any other liquid soap.  You  can apply chg directly to the skin and wash.  Gently with a scrungie or clean washcloth.  5.  Apply the CHG Soap to your body ONLY FROM THE NECK DOWN.   Do   not use on face/ open                           Wound or open sores. Avoid contact with eyes, ears mouth and   genitals (private parts).                       Wash face,  Genitals (private parts) with your normal soap.             6.  Wash thoroughly, paying special attention to the area where your    surgery  will be performed.  7.  Thoroughly rinse your body with warm water from the neck down.  8.  DO NOT shower/wash with your normal soap after using and rinsing off the CHG Soap.                9.  Pat yourself dry with a clean towel.            10.  Wear clean pajamas.            11.  Place  clean sheets on your bed the night of your first shower and do not  sleep with pets. Day of Surgery : Do not apply any CHG, lotions/deodorants the morning of surgery.  Please wear clean clothes to the hospital/surgery center.  FAILURE TO FOLLOW THESE INSTRUCTIONS MAY RESULT IN THE CANCELLATION OF YOUR SURGERY  PATIENT SIGNATURE_________________________________  NURSE SIGNATURE__________________________________  ________________________________________________________________________How to Manage Your Diabetes Before and After Surgery  Why is it important to control my blood sugar before and after surgery? Improving blood sugar levels before and after surgery helps healing and can limit problems. A way of improving blood sugar control is eating a healthy diet by:  Eating less sugar and carbohydrates  Increasing activity/exercise  Talking with your doctor about reaching your blood sugar goals High blood sugars (greater than 180 mg/dL) can raise your risk of infections and slow your recovery, so you will need to focus on controlling your diabetes during the weeks before surgery. Make sure that the doctor who takes care of your diabetes knows about your planned surgery including the date and location.  How do I manage my blood sugar before surgery? Check your blood sugar at least 4 times a day, starting 2 days before surgery, to make sure that the level is not too high or low. Check your blood sugar the morning of your surgery when you wake up and every 2 hours until you get to the Short Stay unit. If your blood sugar is less than 70 mg/dL, you will need to treat for low blood sugar: Do not take insulin. Treat a low blood sugar (less than 70 mg/dL) with  cup of clear juice (cranberry or apple), 4 glucose tablets, OR glucose gel. Recheck blood sugar in 15 minutes after treatment (to make sure it is greater than 70 mg/dL). If your blood sugar is not greater than 70 mg/dL on recheck, call  [phone] for further instructions. Report your blood sugar to the short stay nurse when you get to Short Stay.  If you are admitted to the hospital after surgery: Your blood sugar will be checked by the staff and you will  probably be given insulin after surgery (instead of oral diabetes medicines) to make sure you have good blood sugar levels. The goal for blood sugar control after surgery is 80-180 mg/dL.   WHAT DO I DO ABOUT MY DIABETES MEDICATION?  Do not take oral diabetes medicines (pills) the morning of surgery.  DO NOT TAKE THE FOLLOWING 7 DAYS PRIOR TO SURGERY: Ozempic, Wegovy, Rybelsus (Semaglutide), Byetta (exenatide), Bydureon (exenatide ER), Victoza, Saxenda (liraglutide), or Trulicity (dulaglutide) Mounjaro (Tirzepatide) Adlyxin (Lixisenatide), Polyethylene Glycol Loxenatide.  Patient Signature:  Date:   Nurse Signature:  Date:   Reviewed and Endorsed by [facility] Patient Education Committee, August 2015

## 2024-03-04 NOTE — Progress Notes (Signed)
 COVID Vaccine received:  []  No [x]  Yes Date of any COVID positive Test in last 90 days: no PCP - Dr. Lynwood Mirza [facility] Cardiologist -   Chest x-ray -  EKG -   Stress Test -  ECHO -  Cardiac Cath -   Bowel Prep - [x]  No  []   Yes ______  Pacemaker / ICD device [x]  No []  Yes   Spinal Cord Stimulator:[x]  No []  Yes       History of Sleep Apnea? []  No [x]  Yes   CPAP used?- []  No [x]  Yes    Does the patient monitor blood sugar?          []  No [x]  Yes  []  N/A  Patient has: []  NO Hx DM   []  Pre-DM                 []  DM1  [x]   DM2 Does patient have a [employer] or Dexacom? []  No [x]  Yes   Fasting Blood Sugar Ranges- 116 Checks Blood Sugar ____6_ times a day  GLP1 agonist / usual dose - Mounjaro last dose to be 03/07/24 GLP1 instructions:  SGLT-2 inhibitors / usual dose - no SGLT-2 instructions:   Blood Thinner / Instructions:no Aspirin Instructions:no  Comments:   Activity level: Patient is able  to climb a flight of stairs without difficulty; [x]  No CP  [x]  No SOB,    Patient can perform ADLs without assistance.   Anesthesia review: HTN, DM, OSA, Abnl EKG.  Patient denies shortness of breath, fever, cough and chest pain at PAT appointment.  Patient verbalized understanding and agreement to the Pre-Surgical Instructions that were given to them at this PAT appointment. Patient was also educated of the need to review these PAT instructions again prior to his/her surgery.I reviewed the appropriate phone numbers to call if they have any and questions or concerns.

## 2024-03-05 ENCOUNTER — Encounter (HOSPITAL_COMMUNITY): Payer: Self-pay

## 2024-03-05 ENCOUNTER — Other Ambulatory Visit: Payer: Self-pay

## 2024-03-05 ENCOUNTER — Encounter (HOSPITAL_COMMUNITY)
Admission: RE | Admit: 2024-03-05 | Discharge: 2024-03-05 | Disposition: A | Discharge status: Home / Self Care | Source: Ambulatory Visit | Attending: General Surgery | Admitting: General Surgery

## 2024-03-05 VITALS — BP 144/82 | HR 65 | Resp 16 | Ht 75.0 in | Wt 316.0 lb

## 2024-03-05 DIAGNOSIS — Z7985 Long-term (current) use of injectable non-insulin antidiabetic drugs: Secondary | ICD-10-CM | POA: Insufficient documentation

## 2024-03-05 DIAGNOSIS — Z923 Personal history of irradiation: Secondary | ICD-10-CM | POA: Insufficient documentation

## 2024-03-05 DIAGNOSIS — E669 Obesity, unspecified: Secondary | ICD-10-CM | POA: Insufficient documentation

## 2024-03-05 DIAGNOSIS — I2510 Atherosclerotic heart disease of native coronary artery without angina pectoris: Secondary | ICD-10-CM | POA: Diagnosis not present

## 2024-03-05 DIAGNOSIS — Z9221 Personal history of antineoplastic chemotherapy: Secondary | ICD-10-CM | POA: Diagnosis not present

## 2024-03-05 DIAGNOSIS — I4510 Unspecified right bundle-branch block: Secondary | ICD-10-CM | POA: Insufficient documentation

## 2024-03-05 DIAGNOSIS — K4091 Unilateral inguinal hernia, without obstruction or gangrene, recurrent: Secondary | ICD-10-CM | POA: Diagnosis not present

## 2024-03-05 DIAGNOSIS — Z87891 Personal history of nicotine dependence: Secondary | ICD-10-CM | POA: Insufficient documentation

## 2024-03-05 DIAGNOSIS — Z85048 Personal history of other malignant neoplasm of rectum, rectosigmoid junction, and anus: Secondary | ICD-10-CM | POA: Insufficient documentation

## 2024-03-05 DIAGNOSIS — R9431 Abnormal electrocardiogram [ECG] [EKG]: Secondary | ICD-10-CM | POA: Diagnosis not present

## 2024-03-05 DIAGNOSIS — Z01818 Encounter for other preprocedural examination: Secondary | ICD-10-CM | POA: Insufficient documentation

## 2024-03-05 DIAGNOSIS — G4733 Obstructive sleep apnea (adult) (pediatric): Secondary | ICD-10-CM | POA: Insufficient documentation

## 2024-03-05 DIAGNOSIS — I10 Essential (primary) hypertension: Secondary | ICD-10-CM | POA: Diagnosis not present

## 2024-03-05 DIAGNOSIS — Z6839 Body mass index (BMI) 39.0-39.9, adult: Secondary | ICD-10-CM | POA: Diagnosis not present

## 2024-03-05 DIAGNOSIS — E119 Type 2 diabetes mellitus without complications: Secondary | ICD-10-CM | POA: Insufficient documentation

## 2024-03-05 DIAGNOSIS — R001 Bradycardia, unspecified: Secondary | ICD-10-CM | POA: Diagnosis not present

## 2024-03-05 HISTORY — DX: Personal history of urinary calculi: Z87.442

## 2024-03-05 LAB — CBC
HCT: 38.1 % — ABNORMAL LOW (ref 39.0–52.0)
Hemoglobin: 12.5 g/dL — ABNORMAL LOW (ref 13.0–17.0)
MCH: 32.1 pg (ref 26.0–34.0)
MCHC: 32.8 g/dL (ref 30.0–36.0)
MCV: 97.9 fL (ref 80.0–100.0)
Platelets: 199 K/uL (ref 150–400)
RBC: 3.89 MIL/uL — ABNORMAL LOW (ref 4.22–5.81)
RDW: 12.5 % (ref 11.5–15.5)
WBC: 5.2 K/uL (ref 4.0–10.5)
nRBC: 0 % (ref 0.0–0.2)

## 2024-03-05 LAB — BASIC METABOLIC PANEL WITH GFR
Anion gap: 10 (ref 5–15)
BUN: 12 mg/dL (ref 8–23)
CO2: 25 mmol/L (ref 22–32)
Calcium: 9.5 mg/dL (ref 8.9–10.3)
Chloride: 106 mmol/L (ref 98–111)
Creatinine, Ser: 0.88 mg/dL (ref 0.61–1.24)
GFR, Estimated: >60 mL/min (ref >60)
Glucose, Bld: 88 mg/dL (ref 70–99)
Potassium: 4.2 mmol/L (ref 3.5–5.1)
Sodium: 141 mmol/L (ref 135–145)

## 2024-03-05 LAB — HEMOGLOBIN A1C
Hgb A1c MFr Bld: 4.9 % (ref 4.8–5.6)
Mean Plasma Glucose: 93.93 mg/dL

## 2024-03-09 ENCOUNTER — Encounter (HOSPITAL_COMMUNITY): Payer: Self-pay

## 2024-03-09 NOTE — Progress Notes (Signed)
 Case: [id number] Date/Time: 03/22/24 0715   Procedure: REPAIR, HERNIA, INGUINAL, ROBOT-ASSISTED, LAPAROSCOPIC, USING MESH (Right) - ROBOTIC RIGHT INGUINAL HERNIA REPAIR WITH MESH   Anesthesia type: General   Diagnosis: Unilateral recurrent inguinal hernia without obstruction or gangrene [K40.91]   Pre-op diagnosis: RIGHT INGUINAL HERNIA   Location: [facility] ROOM 05 / [facility]   Surgeons: Rubin Calamity, MD       DISCUSSION: Adam Gregory is a 63 yo male with PMH of former smoking, HTN, CAD (by CT), OSA (uses CPAP), DM, anal cancer s/p chemo and radiation, obesity (BMI 39)  Patient seen by PCP on 11/21/23. BP and DM controlled. Pt has lost 100+ pounds with GLP-1 use.  Hx of anal cancer s/p chemo/radiation in 2020. Follows with Oncology. CT CAP 08/19/23 with no evidence of recurrence. We will continue surveillance with repeat CT in 1 year.  Seen by Cardiology in 09/2022 due to abnormal EKG (RBBB) with CAD on imaging. Stress test, echo, event monitor were ordered which were normal.  VS: BP (!) 144/82   Pulse 65   Resp 16   Ht 6' 3 (1.905 m)   Wt (!) 143.3 kg   SpO2 97%   BMI 39.50 kg/m   PROVIDERS: System, Provider Not In   LABS: Labs reviewed: Acceptable for surgery. (all labs ordered are listed, but only abnormal results are displayed)  Labs Reviewed  CBC - Abnormal; Notable for the following components:      Result Value   RBC 3.89 (*)    Hemoglobin 12.5 (*)    HCT 38.1 (*)    All other components within normal limits  HEMOGLOBIN A1C  BASIC METABOLIC PANEL WITH GFR    EKG 03/05/24:  Sinus bradycardia Right bundle branch block Abnormal ECG  Echo 11/01/22 ([facility]):  Impression Aorta: The aortic root is at upper limit of normal in size-3.4cm. The ascending aorta is at upper limit of normal in size - 3.3cm.   Left Ventricle: Wall motion is normal.   Left Ventricle: Systolic function is borderline hyperdynamic. EF: 68-70%. Mild SAM noted. No elevated LVOT gradient was  noted at rest and with Valsalva maneuver.   Left Ventricle: Doppler parameters indicate normal diastolic function.   Left Ventricle: There is mild to moderate concentric hypertrophy.   Left Ventricle: Left ventricle is borderline to mildly dilated.   Considering patient's weight 379 pounds with BMI 47.4, LV size more likely at upper limit of normal to borderline dilated.   Tricuspid Valve: The right ventricular systolic pressure is normal (<36  mmHg).  No significant valvular disease was noted. It was technically difficult but adequate study. I would avoid dehydration and volume depletion.  Stress test 10/17/22 ([facility])  Impression IMPRESSION: - No evidence of inducible ischemia. -Small size mild intensity fixed basal inferior defect more likely secondary to diaphragmatic attenuation but can't exclude tiny scar. -Small size mild to moderate intensity fixed or basal inferolateral defect secondary to diaphragmatic attenuation versus very small scar. - It was technically difficult study. Chest wall attenuation and diaphragmatic attenuation noted at rest and after Lexiscan infusion and could mildly decrease specificity of this study. - Prognostically this is a low risk scan. - Left ventricular ejection fraction of 69%. Mildly dilated LV. LV end-diastolic volume - 159 ml, end-systolic volume - 49 ml. - Normal left ventricular systolic wall motion.  Past Medical History:  Diagnosis Date   Allergy    Cancer (HCC)    DM2 (diabetes mellitus, type 2) (HCC)    History  of kidney stones    HLD (hyperlipidemia)    HTN (hypertension)    OSA (obstructive sleep apnea)    Rectal cancer (HCC)     Past Surgical History:  Procedure Laterality Date   APPENDECTOMY     HERNIA REPAIR      MEDICATIONS:  amLODipine (NORVASC) 5 MG tablet   cetirizine (ZYRTEC) 10 MG tablet   hydrochlorothiazide (HYDRODIURIL) 12.5 MG tablet   losartan (COZAAR) 100 MG tablet   ondansetron  (ZOFRAN  ODT) 4 MG  disintegrating tablet   tamsulosin  (FLOMAX ) 0.4 MG CAPS capsule   tirzepatide (MOUNJARO) 12.5 MG/0.5ML Pen   No current facility-administered medications for this encounter.   Burnard CHRISTELLA Odis DEVONNA [facility] Surgical Short Stay/Anesthesiology [facility] Phone [phone] 03/09/2024 9:43 AM

## 2024-03-09 NOTE — Anesthesia Preprocedure Evaluation (Addendum)
 Anesthesia Evaluation  Patient identified by MRN, date of birth, ID band Patient awake    Reviewed: Allergy & Precautions, H&P , NPO status , Patient's Chart, lab work & pertinent test results  Airway Mallampati: III  TM Distance: >3 FB Neck ROM: Full    Dental no notable dental hx. (+) Teeth Intact, Dental Advisory Given   Pulmonary sleep apnea and Continuous Positive Airway Pressure Ventilation    Pulmonary exam normal breath sounds clear to auscultation       Cardiovascular Exercise Tolerance: Good hypertension, Pt. on medications  Rhythm:Regular Rate:Normal     Neuro/Psych negative neurological ROS  negative psych ROS   GI/Hepatic negative GI ROS, Neg liver ROS,,,  Endo/Other  diabetes, Type 2  Class 3 obesity  Renal/GU negative Renal ROS  negative genitourinary   Musculoskeletal   Abdominal   Peds  Hematology negative hematology ROS (+)   Anesthesia Other Findings   Reproductive/Obstetrics negative OB ROS                              Anesthesia Physical Anesthesia Plan  ASA: 3  Anesthesia Plan: General   Post-op Pain Management: Tylenol  PO (pre-op)*   Induction: Intravenous  PONV Risk Score and Plan: 3 and Ondansetron , Dexamethasone and Treatment may vary due to age or medical condition  Airway Management Planned: Oral ETT  Additional Equipment:   Intra-op Plan:   Post-operative Plan: Extubation in OR  Informed Consent: I have reviewed the patients History and Physical, chart, labs and discussed the procedure including the risks, benefits and alternatives for the proposed anesthesia with the patient or authorized representative who has indicated his/her understanding and acceptance.     Dental advisory given  Plan Discussed with: CRNA  Anesthesia Plan Comments: (See PAT note from 10/3)         Anesthesia Quick Evaluation

## 2024-03-19 NOTE — H&P (Signed)
 Chief Complaint: New Problem  (small bowel containing right ing hernia, discuss lap or robotic SX )       History of Present Illness: Adam Gregory is a 63 y.o. male who is seen today as an office consultation at the request of Dr. [facility] for evaluation of New Problem  (small bowel containing right ing hernia, discuss lap or robotic SX ) .   History of Present Illness Adam Gregory is a 63 year old male with a history of inguinal hernias who presents with concerns about a recurrent hernia. He was referred by Dr. Vernetta for evaluation of his hernia.   He experienced a bulge in the right inguinal area after consuming red meat, initially attributing it to dietary changes. He later identified it as related to his hernia, with associated nausea and pain. He was unable to reduce the hernia himself, leading to medical intervention where Dilaudid was administered, and the hernia was reduced without surgery. No recurrence of the hernia since that intervention.   He has a history of two right inguinal hernias, both previously repaired with open incisions, and a left inguinal hernia. In the past, he could manually reduce the hernias, but not this time.   He is allergic to codeine and Vicodin, tolerating Percocet well for pain management. He is on Mounjaro for weight management, having lost 125 pounds. He lives alone and plans for his brother to assist post-surgery, traveling from [facility]. No current pain, bulging, or symptoms related to his hernia.       Review of Systems: A complete review of systems was obtained from the patient.  I have reviewed this information and discussed as appropriate with the patient.  See HPI as well for other ROS.   Review of Systems  Constitutional:  Negative for fever.  HENT:  Negative for congestion.   Eyes:  Negative for blurred vision.  Respiratory:  Negative for cough, shortness of breath and wheezing.   Cardiovascular:  Negative for chest pain  and palpitations.  Gastrointestinal:  Negative for heartburn.  Genitourinary:  Negative for dysuria.  Musculoskeletal:  Negative for myalgias.  Skin:  Negative for rash.  Neurological:  Negative for dizziness and headaches.  Psychiatric/Behavioral:  Negative for depression and suicidal ideas.   All other systems reviewed and are negative.       Medical History: Past Medical History Past Medical History: Diagnosis Date  History of cancer         Problem List There is no problem list on file for this patient.     Past Surgical History Past Surgical History: Procedure Laterality Date  APPENDECTOMY      HERNIA REPAIR      squamous cell carcinoma          Allergies Allergies Allergen Reactions  Hydrocodone-Acetaminophen  Itching and Other (See Comments)  Iodinated Contrast Media Hives, Itching and Other (See Comments)     Sends into cardiac arrest Anaphylaxis    Sends into cardiac arrest, Anaphylaxis    Sends into cardiac arrest  Codeine Other (See Comments) and Nausea And Vomiting  Shellfish Containing Products Hives      Medications Ordered Prior to Encounter Current Outpatient Medications on File Prior to Visit Medication Sig Dispense Refill  amLODIPine (NORVASC) 5 MG tablet Take 5 mg by mouth once daily      atorvastatin (LIPITOR) 20 MG tablet Take 20 mg by mouth once daily      blood-glucose sensor (DEXCOM G7 SENSOR) Devi APPLY 1 EACH TOPICALLY  EVERY 10 DAYS. TO MONITOR BLOOD SUGAR      cetirizine (ZYRTEC) 10 MG tablet Take 10 mg by mouth once daily      hydroCHLOROthiazide (HYDRODIURIL) 12.5 MG tablet Take 12.5 mg by mouth once daily      losartan (COZAAR) 100 MG tablet Take 100 mg by mouth once daily      metoprolol TARTrate (LOPRESSOR) 25 MG tablet Take 12.5 mg by mouth      MOUNJARO 15 mg/0.5 mL pen injector Inject 15 mg subcutaneously every 7 (seven) days      semaglutide (OZEMPIC) 1 mg/dose (4 mg/3 mL) pen injector Inject 1 mg subcutaneously every 7  (seven) days        No current facility-administered medications on file prior to visit.      Family History Family History Problem Relation Age of Onset  Hyperlipidemia (Elevated cholesterol) Mother    High blood pressure (Hypertension) Mother    Diabetes Father        Tobacco Use History Social History    Tobacco Use Smoking Status Never Smokeless Tobacco Never      Social History Social History    Socioeconomic History  Marital status: Single Tobacco Use  Smoking status: Never  Smokeless tobacco: Never Vaping Use  Vaping status: Never Used Substance and Sexual Activity  Alcohol use: Never  Drug use: Never  Sexual activity: Defer    Social Drivers of Health    Financial Resource Strain: Low Risk  (11/20/2023)   Received from [facility]   Overall Financial Resource Strain (CARDIA)    Difficulty of Paying Living Expenses: Not hard at all Food Insecurity: No Food Insecurity (11/20/2023)   Received from [facility]   Hunger Vital Sign    Within the past 12 months, you worried that your food would run out before you got the money to buy more.: Never true    Within the past 12 months, the food you bought just didn't last and you didn't have money to get more.: Never true Transportation Needs: No Transportation Needs (11/20/2023)   Received from [facility] - Transportation    Lack of Transportation (Medical): No    Lack of Transportation (Non-Medical): No Physical Activity: Insufficiently Active (11/20/2023)   Received from [facility]   Exercise Vital Sign    On average, how many days per week do you engage in moderate to strenuous exercise (like a brisk walk)?: 2 days    On average, how many minutes do you engage in exercise at this level?: 20 min Stress: No Stress Concern Present (11/20/2023)   Received from [facility] - Occupational Stress Questionnaire    Feeling of Stress : Not at  all Social Connections: Socially Integrated (11/20/2023)   Received from [facility]   Social Network    How would you rate your social network (family, work, friends)?: Good participation with social networks Housing Stability: Unknown (01/06/2024)   Housing Stability Vital Sign    Homeless in the Last Year: No      Objective:     Vitals:   01/15/24 1431 PainSc: 0-No pain   There is no height or weight on file to calculate BMI. Physical Exam Constitutional:      Appearance: Normal appearance.  HENT:     Head: Normocephalic and atraumatic.     Nose: Nose normal. No congestion.     Mouth/Throat:     Mouth: Mucous membranes are moist.  Pharynx: Oropharynx is clear.  Eyes:     Pupils: Pupils are equal, round, and reactive to light.  Cardiovascular:     Rate and Rhythm: Normal rate and regular rhythm.     Pulses: Normal pulses.     Heart sounds: Normal heart sounds. No murmur heard.    No friction rub. No gallop.  Pulmonary:     Effort: Pulmonary effort is normal. No respiratory distress.     Breath sounds: Normal breath sounds. No stridor. No wheezing, rhonchi or rales.  Abdominal:     General: Abdomen is flat.     Hernia: A hernia is present. Hernia is present in the left inguinal area and right inguinal area.  Musculoskeletal:        General: Normal range of motion.     Cervical back: Normal range of motion.  Skin:    General: Skin is warm and dry.  Neurological:     General: No focal deficit present.     Mental Status: He is alert and oriented to person, place, and time.  Psychiatric:        Mood and Affect: Mood normal.        Thought Content: Thought content normal.         Assessment and Plan: Diagnoses and all orders for this visit:   Unilateral recurrent inguinal hernia without obstruction or gangrene     Adam Gregory is a 63 y.o. male    1.  We will proceed to the OR for a ROBOTIC RIGHT inguinal hernia repair with mesh. 2. All risks and  benefits were discussed with the patient, to generally include infection, bleeding, damage to surrounding structures, acute and chronic nerve pain, and recurrence. Alternatives were offered and described.  All questions were answered and the patient voiced understanding of the procedure and wishes to proceed at this point.             No follow-ups on file.   Lynda Leos, MD, [facility], [place] [facility]

## 2024-03-22 ENCOUNTER — Encounter (HOSPITAL_COMMUNITY)
Admission: RE | Disposition: A | Discharge status: Home / Self Care | Payer: Self-pay | Source: Ambulatory Visit | Attending: General Surgery

## 2024-03-22 ENCOUNTER — Ambulatory Visit (HOSPITAL_COMMUNITY): Payer: Self-pay | Admitting: Physician Assistant

## 2024-03-22 ENCOUNTER — Encounter (HOSPITAL_COMMUNITY): Payer: Self-pay | Admitting: General Surgery

## 2024-03-22 ENCOUNTER — Ambulatory Visit (HOSPITAL_COMMUNITY): Payer: Self-pay | Admitting: Anesthesiology

## 2024-03-22 ENCOUNTER — Ambulatory Visit (HOSPITAL_COMMUNITY)
Admission: RE | Admit: 2024-03-22 | Discharge: 2024-03-22 | Disposition: A | Discharge status: Home / Self Care | Source: Ambulatory Visit | Attending: General Surgery | Admitting: General Surgery

## 2024-03-22 ENCOUNTER — Other Ambulatory Visit: Payer: Self-pay

## 2024-03-22 DIAGNOSIS — Z9889 Other specified postprocedural states: Secondary | ICD-10-CM | POA: Insufficient documentation

## 2024-03-22 DIAGNOSIS — D176 Benign lipomatous neoplasm of spermatic cord: Secondary | ICD-10-CM | POA: Insufficient documentation

## 2024-03-22 DIAGNOSIS — K4091 Unilateral inguinal hernia, without obstruction or gangrene, recurrent: Secondary | ICD-10-CM | POA: Diagnosis present

## 2024-03-22 DIAGNOSIS — I10 Essential (primary) hypertension: Secondary | ICD-10-CM | POA: Insufficient documentation

## 2024-03-22 DIAGNOSIS — Z885 Allergy status to narcotic agent status: Secondary | ICD-10-CM | POA: Insufficient documentation

## 2024-03-22 DIAGNOSIS — G4730 Sleep apnea, unspecified: Secondary | ICD-10-CM | POA: Insufficient documentation

## 2024-03-22 DIAGNOSIS — E119 Type 2 diabetes mellitus without complications: Secondary | ICD-10-CM | POA: Diagnosis not present

## 2024-03-22 DIAGNOSIS — Z79899 Other long term (current) drug therapy: Secondary | ICD-10-CM | POA: Diagnosis not present

## 2024-03-22 DIAGNOSIS — Z7985 Long-term (current) use of injectable non-insulin antidiabetic drugs: Secondary | ICD-10-CM | POA: Insufficient documentation

## 2024-03-22 DIAGNOSIS — E66813 Obesity, class 3: Secondary | ICD-10-CM | POA: Diagnosis not present

## 2024-03-22 DIAGNOSIS — Z6838 Body mass index (BMI) 38.0-38.9, adult: Secondary | ICD-10-CM | POA: Insufficient documentation

## 2024-03-22 HISTORY — PX: XI ROBOTIC ASSISTED INGUINAL HERNIA REPAIR WITH MESH: SHX6706

## 2024-03-22 LAB — GLUCOSE, CAPILLARY
Glucose-Capillary: 101 mg/dL — ABNORMAL HIGH (ref 70–99)
Glucose-Capillary: 138 mg/dL — ABNORMAL HIGH (ref 70–99)

## 2024-03-22 SURGERY — REPAIR, HERNIA, INGUINAL, ROBOT-ASSISTED, LAPAROSCOPIC, USING MESH
Anesthesia: General | Laterality: Right

## 2024-03-22 MED ORDER — MIDAZOLAM HCL 5 MG/5ML IJ SOLN
INTRAMUSCULAR | Status: DC | PRN
  Administered 2024-03-22 (×2): 1 mg via INTRAVENOUS

## 2024-03-22 MED ORDER — FENTANYL CITRATE (PF) 50 MCG/ML IJ SOSY: 25.0000 ug | PREFILLED_SYRINGE | INTRAMUSCULAR | Status: DC | PRN

## 2024-03-22 MED ORDER — BUPIVACAINE-EPINEPHRINE (PF) 0.25% -1:200000 IJ SOLN
INTRAMUSCULAR | Status: DC | PRN
  Administered 2024-03-22: 25 mL via PERINEURAL

## 2024-03-22 MED ORDER — FENTANYL CITRATE (PF) 100 MCG/2ML IJ SOLN
INTRAMUSCULAR | Status: AC
  Filled 2024-03-22: qty 2

## 2024-03-22 MED ORDER — DEXMEDETOMIDINE HCL IN NACL 80 MCG/20ML IV SOLN
INTRAVENOUS | Status: DC | PRN
Start: 2024-03-22 — End: 2024-03-22
  Administered 2024-03-22: 8 ug via INTRAVENOUS

## 2024-03-22 MED ORDER — ACETAMINOPHEN 500 MG PO TABS: 1000.0000 mg | ORAL_TABLET | ORAL | Status: AC

## 2024-03-22 MED ORDER — ONDANSETRON HCL 4 MG/2ML IJ SOLN
INTRAMUSCULAR | Status: DC | PRN
  Administered 2024-03-22: 4 mg via INTRAVENOUS

## 2024-03-22 MED ORDER — EPHEDRINE 5 MG/ML INJ
INTRAVENOUS | Status: AC
Start: 2024-03-22 — End: 2024-03-22
  Filled 2024-03-22: qty 5

## 2024-03-22 MED ORDER — ORAL CARE MOUTH RINSE: 15.0000 mL | Freq: Once | OROMUCOSAL | Status: AC

## 2024-03-22 MED ORDER — PHENYLEPHRINE 80 MCG/ML (10ML) SYRINGE FOR IV PUSH (FOR BLOOD PRESSURE SUPPORT)
PREFILLED_SYRINGE | INTRAVENOUS | Status: AC
  Filled 2024-03-22: qty 10

## 2024-03-22 MED ORDER — CHLORHEXIDINE GLUCONATE CLOTH 2 % EX PADS: 6.0000 | MEDICATED_PAD | Freq: Once | CUTANEOUS | Status: DC

## 2024-03-22 MED ORDER — LIDOCAINE HCL (PF) 2 % IJ SOLN
INTRAMUSCULAR | Status: AC
  Filled 2024-03-22: qty 10

## 2024-03-22 MED ORDER — ACETAMINOPHEN 500 MG PO TABS
1000.0000 mg | ORAL_TABLET | Freq: Once | ORAL | Status: AC
  Administered 2024-03-22: 1000 mg via ORAL
  Filled 2024-03-22: qty 2

## 2024-03-22 MED ORDER — ENSURE PRE-SURGERY PO LIQD
296.0000 mL | Freq: Once | ORAL | Status: DC
  Filled 2024-03-22: qty 296

## 2024-03-22 MED ORDER — PROPOFOL 10 MG/ML IV BOLUS
INTRAVENOUS | Status: DC | PRN
  Administered 2024-03-22: 200 mg via INTRAVENOUS

## 2024-03-22 MED ORDER — EPHEDRINE 5 MG/ML INJ
INTRAVENOUS | Status: AC
  Filled 2024-03-22: qty 5

## 2024-03-22 MED ORDER — PROPOFOL 10 MG/ML IV BOLUS
INTRAVENOUS | Status: AC
  Filled 2024-03-22: qty 20

## 2024-03-22 MED ORDER — 0.9 % SODIUM CHLORIDE (POUR BTL) OPTIME
TOPICAL | Status: DC | PRN
  Administered 2024-03-22: 1000 mL

## 2024-03-22 MED ORDER — LIDOCAINE HCL (PF) 2 % IJ SOLN
INTRAMUSCULAR | Status: AC
  Filled 2024-03-22: qty 5

## 2024-03-22 MED ORDER — LACTATED RINGERS IV SOLN: INTRAVENOUS | Status: DC

## 2024-03-22 MED ORDER — INSULIN ASPART 100 UNIT/ML IJ SOLN: 0.0000 [IU] | INTRAMUSCULAR | Status: DC | PRN

## 2024-03-22 MED ORDER — PHENYLEPHRINE 80 MCG/ML (10ML) SYRINGE FOR IV PUSH (FOR BLOOD PRESSURE SUPPORT)
PREFILLED_SYRINGE | INTRAVENOUS | Status: AC
Start: 2024-03-22 — End: 2024-03-22
  Filled 2024-03-22: qty 10

## 2024-03-22 MED ORDER — DEXAMETHASONE SOD PHOSPHATE PF 10 MG/ML IJ SOLN
INTRAMUSCULAR | Status: DC | PRN
  Administered 2024-03-22: 4 mg via INTRAVENOUS

## 2024-03-22 MED ORDER — SUGAMMADEX SODIUM 200 MG/2ML IV SOLN
INTRAVENOUS | Status: DC | PRN
  Administered 2024-03-22: 200 mg via INTRAVENOUS

## 2024-03-22 MED ORDER — ROCURONIUM BROMIDE 10 MG/ML (PF) SYRINGE
PREFILLED_SYRINGE | INTRAVENOUS | Status: AC
  Filled 2024-03-22: qty 20

## 2024-03-22 MED ORDER — SUGAMMADEX SODIUM 200 MG/2ML IV SOLN
INTRAVENOUS | Status: AC
  Filled 2024-03-22: qty 4

## 2024-03-22 MED ORDER — CEFAZOLIN SODIUM-DEXTROSE 3-4 GM/150ML-% IV SOLN
3.0000 g | INTRAVENOUS | Status: AC
  Administered 2024-03-22: 3 g via INTRAVENOUS
  Filled 2024-03-22: qty 150

## 2024-03-22 MED ORDER — CHLORHEXIDINE GLUCONATE 0.12 % MT SOLN
15.0000 mL | Freq: Once | OROMUCOSAL | Status: AC
  Administered 2024-03-22: 15 mL via OROMUCOSAL

## 2024-03-22 MED ORDER — BUPIVACAINE-EPINEPHRINE (PF) 0.25% -1:200000 IJ SOLN
INTRAMUSCULAR | Status: AC
  Filled 2024-03-22: qty 30

## 2024-03-22 MED ORDER — ROCURONIUM BROMIDE 10 MG/ML (PF) SYRINGE
PREFILLED_SYRINGE | INTRAVENOUS | Status: DC | PRN
  Administered 2024-03-22: 10 mg via INTRAVENOUS
  Administered 2024-03-22: 60 mg via INTRAVENOUS

## 2024-03-22 MED ORDER — MIDAZOLAM HCL 2 MG/2ML IJ SOLN
INTRAMUSCULAR | Status: AC
  Filled 2024-03-22: qty 2

## 2024-03-22 MED ORDER — LIDOCAINE 2% (20 MG/ML) 5 ML SYRINGE
INTRAMUSCULAR | Status: DC | PRN
  Administered 2024-03-22: 100 mg via INTRAVENOUS

## 2024-03-22 MED ORDER — FENTANYL CITRATE (PF) 100 MCG/2ML IJ SOLN
INTRAMUSCULAR | Status: DC | PRN
  Administered 2024-03-22 (×4): 50 ug via INTRAVENOUS

## 2024-03-22 MED ORDER — ONDANSETRON HCL 4 MG/2ML IJ SOLN
INTRAMUSCULAR | Status: AC
  Filled 2024-03-22: qty 2

## 2024-03-22 MED ORDER — ONDANSETRON HCL 4 MG/2ML IJ SOLN
INTRAMUSCULAR | Status: AC
  Filled 2024-03-22: qty 4

## 2024-03-22 MED ORDER — TRAMADOL HCL 50 MG PO TABS: 50.0000 mg | ORAL_TABLET | Freq: Four times a day (QID) | ORAL | 0 refills | Status: AC | PRN

## 2024-03-22 SURGICAL SUPPLY — 41 items
BAG COUNTER SPONGE SURGICOUNT (BAG) IMPLANT
CHLORAPREP W/TINT 26 (MISCELLANEOUS) ×1 IMPLANT
COVER SURGICAL LIGHT HANDLE (MISCELLANEOUS) ×1 IMPLANT
COVER TIP SHEARS 8 DVNC (MISCELLANEOUS) ×1 IMPLANT
DEFOGGER SCOPE WARM SEASHARP (MISCELLANEOUS) ×1 IMPLANT
DERMABOND ADVANCED .7 DNX12 (GAUZE/BANDAGES/DRESSINGS) ×1 IMPLANT
DEVICE TROCAR PUNCTURE CLOSURE (ENDOMECHANICALS) ×1 IMPLANT
DRAPE ARM DVNC X/XI (DISPOSABLE) ×4 IMPLANT
DRAPE COLUMN DVNC XI (DISPOSABLE) ×1 IMPLANT
DRAPE CV SPLIT W-CLR ANES SCRN (DRAPES) ×1 IMPLANT
DRAPE SURG ORHT 6 SPLT 77X108 (DRAPES) ×1 IMPLANT
DRIVER NDL MEGA SUTCUT DVNCXI (INSTRUMENTS) ×1 IMPLANT
DRIVER NDLE MEGA SUTCUT DVNCXI (INSTRUMENTS) ×1 IMPLANT
ELECTRODE REM PT RTRN 9FT ADLT (ELECTROSURGICAL) ×1 IMPLANT
FORCEPS PROGRASP DVNC XI (FORCEP) ×1 IMPLANT
GLOVE BIO SURGEON STRL SZ7.5 (GLOVE) ×5 IMPLANT
GOWN STRL REUS W/ TWL LRG LVL3 (GOWN DISPOSABLE) ×2 IMPLANT
GOWN STRL REUS W/ TWL XL LVL3 (GOWN DISPOSABLE) ×2 IMPLANT
GOWN STRL REUS W/TWL 2XL LVL3 (GOWN DISPOSABLE) ×1 IMPLANT
IRRIGATION SUCT STRKRFLW 2 WTP (MISCELLANEOUS) IMPLANT
KIT BASIN OR (CUSTOM PROCEDURE TRAY) ×1 IMPLANT
MARKER SKIN DUAL TIP RULER LAB (MISCELLANEOUS) ×1 IMPLANT
MESH PROGRIP LAP SLF FIX 16X12 (Mesh General) ×1 IMPLANT
NDL HYPO 22X1.5 SAFETY MO (MISCELLANEOUS) ×1 IMPLANT
NDL INSUFFLATION 14GA 120MM (NEEDLE) ×1 IMPLANT
NEEDLE HYPO 22X1.5 SAFETY MO (MISCELLANEOUS) ×1 IMPLANT
NEEDLE INSUFFLATION 14GA 120MM (NEEDLE) ×1 IMPLANT
OBTURATOR OPTICALSTD 8 DVNC (TROCAR) IMPLANT
PAD ARMBOARD POSITIONER FOAM (MISCELLANEOUS) ×2 IMPLANT
SCISSORS MNPLR CVD DVNC XI (INSTRUMENTS) ×1 IMPLANT
SEAL UNIV 5-12 XI (MISCELLANEOUS) ×2 IMPLANT
SET TUBE SMOKE EVAC HIGH FLOW (TUBING) ×1 IMPLANT
SPIKE FLUID TRANSFER (MISCELLANEOUS) ×1 IMPLANT
STOPCOCK 4 WAY LG BORE MALE ST (IV SETS) ×1 IMPLANT
SUT MNCRL AB 4-0 PS2 18 (SUTURE) ×1 IMPLANT
SUT STRATA 2-0 15 CT-1.5 (SUTURE) ×1 IMPLANT
SUT STRATA PDS 2-0 23 CT-1 (SUTURE) ×1 IMPLANT
SUT VIC AB 2-0 SH 27X BRD (SUTURE) IMPLANT
SUT VICRYL 0 UR6 27IN ABS (SUTURE) IMPLANT
TOWEL GREEN STERILE FF (TOWEL DISPOSABLE) ×1 IMPLANT
TRAY LAPAROSCOPIC (CUSTOM PROCEDURE TRAY) ×1 IMPLANT

## 2024-03-22 NOTE — Op Note (Signed)
 03/22/2024  8:47 AM  PATIENT:  Adam Gregory  63 y.o. male  PRE-OPERATIVE DIAGNOSIS:  RIGHT INGUINAL HERNIA  POST-OPERATIVE DIAGNOSIS:  RIGHT  INDIRECT INGUINAL HERNIA, CORD LIPOMA  PROCEDURE:  Procedure(s) with comments: REPAIR, HERNIA, INGUINAL, ROBOT-ASSISTED, LAPAROSCOPIC, USING MESH (Right) - ROBOTIC RIGHT INGUINAL HERNIA REPAIR WITH MESH  SURGEON:  Surgeons and Role:    * Rubin Calamity, MD - Primary    * Maczis, Tonja Barban, PA-C - Assisting  ANESTHESIA:   local and general  EBL:  minimal   BLOOD ADMINISTERED:none  DRAINS: none   LOCAL MEDICATIONS USED:  MARCAINE     SPECIMEN:  No Specimen  DISPOSITION OF SPECIMEN:  N/A  COUNTS:  YES  TOURNIQUET:  * No tourniquets in log *  DICTATION: .Dragon Dictation  Details of the procedure:   Findings: LArge Right Indirect inguinal hernia and cord lipoma  The patient was taken back to the operating room. The patient was placed in supine position with bilateral SCDs in place.  A Foley catheter was placed.  The patient was prepped and draped in the usual sterile fashion.  After appropriate anitbiotics were confirmed, a time-out was confirmed and all facts were verified.  At this time a Veress needle technique was used to inspect the abdomen approximately 10 cm from the valgus and the paramedian line. This time a 8 mm robotic trocar was placed into the abdomen. The camera was placed there was no injury to any intra-abdominal organs. A 8mm umbilical port was placed just superior to the umbilicus. An 8 mm port was placed approximately 10 cm lateral to the umbilicus on the left paramedian side.   Robot was positioned over the patient and the ports were docked in the usual fashion.  At this time the right-sided peritoneum was taken down from the medial umbilical ligament laterally. The pre-peritoneal space was entered. Dissection was taken down to Cooper's ligament. At this time it was apparent there was an indirect hernia.  At  this time I proceeded dissect out the rest Cooper's ligament and the medial to lateral direction. I proceeded laterally to dissect the spermatic cord. The spermatic cord was circumferentially dissected away from the surrounding musculature and tissue. The vas deferens was identified and protected all portions of the case. There was an indirect hernia.  The hernia sac was dissected away from the peritoneum. This was dissected back. There was a large cord lipoma that reduced from the inguinal canal.  At this time I proceeded to create a pocket laterally for the mesh. Once the pocket was created the peritoneum was stripped back to approximately the base of the cord. At this time the piece of Progrip 16x12cm mesh was in placed into the dissected area. This covered both the direct and indirect spaces appropriately. This also covered  The femoral space.  The mesh lay flat from medial to lateral. A 2-0 vicryl was used to anchor this at Coopers ligament and to the anterior abdominal wall.  At this time a 2-0 stratfix stitch was used to close the peritoneum in a standard running fashion.  At this time the robot was undocked. The umbilical port site was reapproximated using a 0 Vicryl via an Endo Close device 1. All ports were removed. The skin was reapproximated and all port sites using 4-0 Monocryl subcuticular fashion.  The patient the procedure well was taken to the recovery    PLAN OF CARE: Discharge to home after PACU  PATIENT DISPOSITION:  PACU - hemodynamically stable.  Delay start of Pharmacological VTE agent (>24hrs) due to surgical blood loss or risk of bleeding: not applicable

## 2024-03-22 NOTE — Interval H&P Note (Signed)
 History and Physical Interval Note:  03/22/2024 7:03 AM  Adam Gregory  has presented today for surgery, with the diagnosis of RIGHT INGUINAL HERNIA.  The various methods of treatment have been discussed with the patient and family. After consideration of risks, benefits and other options for treatment, the patient has consented to  Procedure(s) with comments: REPAIR, HERNIA, INGUINAL, ROBOT-ASSISTED, LAPAROSCOPIC, USING MESH (Right) - ROBOTIC RIGHT INGUINAL HERNIA REPAIR WITH MESH as a surgical intervention.  The patient's history has been reviewed, patient examined, no change in status, stable for surgery.  I have reviewed the patient's chart and labs.  Questions were answered to the patient's satisfaction.     [name]

## 2024-03-22 NOTE — Anesthesia Postprocedure Evaluation (Signed)
 Anesthesia Post Note  Patient: Aziel Morgan  Procedure(s) Performed: REPAIR, HERNIA, INGUINAL, ROBOT-ASSISTED, LAPAROSCOPIC, USING MESH (Right)     Patient location during evaluation: PACU Anesthesia Type: General Level of consciousness: awake and alert Pain management: pain level controlled Vital Signs Assessment: post-procedure vital signs reviewed and stable Respiratory status: spontaneous breathing, nonlabored ventilation and respiratory function stable Cardiovascular status: blood pressure returned to baseline and stable Postop Assessment: no apparent nausea or vomiting Anesthetic complications: no   No notable events documented.  Last Vitals:  Vitals:   03/22/24 0945 03/22/24 1000  BP: (!) 140/71 (!) 156/67  Pulse: (!) 52 (!) 58  Resp: 14 15  Temp: (!) 36 C (!) 35.9 C  SpO2: 93% 93%    Last Pain:  Vitals:   03/22/24 1000  TempSrc: Oral  PainSc: 1                  Valeen Borys,W. EDMOND

## 2024-03-22 NOTE — Anesthesia Procedure Notes (Signed)
 Procedure Name: Intubation Date/Time: 03/22/2024 7:38 AM  Performed by: Kathern Rollene LABOR, CRNAPre-anesthesia Checklist: Patient identified, Emergency Drugs available, Suction available and Patient being monitored Patient Re-evaluated:Patient Re-evaluated prior to induction Oxygen Delivery Method: Circle system utilized Preoxygenation: Pre-oxygenation with 100% oxygen Induction Type: IV induction Ventilation: Mask ventilation without difficulty Laryngoscope Size: Glidescope and 4 Grade View: Grade I Tube type: Oral Tube size: 7.5 mm Number of attempts: 1 Airway Equipment and Method: Video-laryngoscopy Placement Confirmation: ETT inserted through vocal cords under direct vision, positive ETCO2 and breath sounds checked- equal and bilateral Secured at: 24 cm Tube secured with: Tape Dental Injury: Teeth and Oropharynx as per pre-operative assessment  Difficulty Due To: Difficulty was anticipated

## 2024-03-22 NOTE — Discharge Instructions (Signed)

## 2024-03-22 NOTE — Transfer of Care (Signed)
 Immediate Anesthesia Transfer of Care Note  Patient: Adam Gregory  Procedure(s) Performed: REPAIR, HERNIA, INGUINAL, ROBOT-ASSISTED, LAPAROSCOPIC, USING MESH (Right)  Patient Location: PACU  Anesthesia Type:General  Level of Consciousness: awake, alert , oriented, and patient cooperative  Airway & Oxygen Therapy: Patient Spontanous Breathing and Patient connected to face mask oxygen  Post-op Assessment: Report given to RN and Post -op Vital signs reviewed and stable  Post vital signs: Reviewed and stable  Last Vitals:  Vitals Value Taken Time  BP 136/67 03/22/24 09:04  Temp 35.9 C 03/22/24 09:04  Pulse 55 03/22/24 09:09  Resp 16 03/22/24 09:08  SpO2 100 % 03/22/24 09:09  Vitals shown include unfiled device data.  Last Pain:  Vitals:   03/22/24 0904  TempSrc:   PainSc: Asleep      Patients Stated Pain Goal: 5 (03/22/24 0553)  Complications: No notable events documented.

## 2024-03-23 ENCOUNTER — Encounter (HOSPITAL_COMMUNITY): Payer: Self-pay | Admitting: General Surgery
